# Patient Record
Sex: Female | Born: 1935 | Race: White | Hispanic: No | Marital: Married | Smoking: Never smoker
Health system: Southern US, Community
[De-identification: ages and names within clinical notes are randomized; demographics above are authoritative.]

## PROBLEM LIST (undated history)

## (undated) DIAGNOSIS — I639 Cerebral infarction, unspecified: Secondary | ICD-10-CM

## (undated) DIAGNOSIS — E079 Disorder of thyroid, unspecified: Secondary | ICD-10-CM

## (undated) DIAGNOSIS — I10 Essential (primary) hypertension: Secondary | ICD-10-CM

## (undated) DIAGNOSIS — R569 Unspecified convulsions: Secondary | ICD-10-CM

## (undated) DIAGNOSIS — I2510 Atherosclerotic heart disease of native coronary artery without angina pectoris: Secondary | ICD-10-CM

## (undated) HISTORY — PX: ABDOMINAL HYSTERECTOMY: SHX81

## (undated) HISTORY — PX: FRACTURE SURGERY: SHX138

---

## 2007-10-07 ENCOUNTER — Emergency Department: Payer: Self-pay | Admitting: Emergency Medicine

## 2008-03-25 ENCOUNTER — Ambulatory Visit: Payer: Self-pay | Admitting: Neurology

## 2009-02-10 ENCOUNTER — Emergency Department: Payer: Self-pay | Admitting: Emergency Medicine

## 2012-08-31 LAB — BASIC METABOLIC PANEL
Anion Gap: 8 (ref 7–16)
BUN: 18 mg/dL (ref 7–18)
Calcium, Total: 8.5 mg/dL (ref 8.5–10.1)
Chloride: 99 mmol/L (ref 98–107)
Co2: 24 mmol/L (ref 21–32)
Creatinine: 1.14 mg/dL (ref 0.60–1.30)
EGFR (African American): 49 — ABNORMAL LOW
EGFR (Non-African Amer.): 42 — ABNORMAL LOW
Potassium: 4 mmol/L (ref 3.5–5.1)

## 2012-08-31 LAB — CBC
HCT: 35.4 % (ref 35.0–47.0)
HGB: 12.2 g/dL (ref 12.0–16.0)
MCHC: 34.4 g/dL (ref 32.0–36.0)
Platelet: 209 10*3/uL (ref 150–440)
RBC: 3.58 10*6/uL — ABNORMAL LOW (ref 3.80–5.20)
WBC: 11.1 10*3/uL — ABNORMAL HIGH (ref 3.6–11.0)

## 2012-08-31 LAB — TROPONIN I: Troponin-I: <0.02 ng/mL

## 2012-09-01 ENCOUNTER — Observation Stay: Payer: Self-pay | Admitting: Internal Medicine

## 2012-09-01 DIAGNOSIS — I359 Nonrheumatic aortic valve disorder, unspecified: Secondary | ICD-10-CM

## 2012-09-01 LAB — URINALYSIS, COMPLETE
Bilirubin,UR: NEGATIVE
Nitrite: NEGATIVE
Ph: 7 (ref 4.5–8.0)
Protein: 30
RBC,UR: <1 /HPF (ref 0–5)
Specific Gravity: 1.02 (ref 1.003–1.030)
Squamous Epithelial: NONE SEEN

## 2012-09-01 LAB — BASIC METABOLIC PANEL
BUN: 16 mg/dL (ref 7–18)
Calcium, Total: 8.1 mg/dL — ABNORMAL LOW (ref 8.5–10.1)
Co2: 25 mmol/L (ref 21–32)
EGFR (Non-African Amer.): >60
Glucose: 105 mg/dL — ABNORMAL HIGH (ref 65–99)
Osmolality: 272 (ref 275–301)
Sodium: 135 mmol/L — ABNORMAL LOW (ref 136–145)

## 2012-09-19 ENCOUNTER — Emergency Department: Payer: Self-pay | Admitting: Emergency Medicine

## 2012-09-19 LAB — LIPASE, BLOOD: Lipase: 126 U/L (ref 73–393)

## 2012-09-19 LAB — CBC
HCT: 32.3 % — ABNORMAL LOW (ref 35.0–47.0)
HGB: 11.2 g/dL — ABNORMAL LOW (ref 12.0–16.0)
MCV: 97 fL (ref 80–100)
Platelet: 197 10*3/uL (ref 150–440)
RBC: 3.32 10*6/uL — ABNORMAL LOW (ref 3.80–5.20)
RDW: 12.8 % (ref 11.5–14.5)
WBC: 12.2 10*3/uL — ABNORMAL HIGH (ref 3.6–11.0)

## 2012-09-19 LAB — COMPREHENSIVE METABOLIC PANEL
BUN: 13 mg/dL (ref 7–18)
Bilirubin,Total: 0.6 mg/dL (ref 0.2–1.0)
Chloride: 99 mmol/L (ref 98–107)
Co2: 26 mmol/L (ref 21–32)
EGFR (African American): >60
Glucose: 86 mg/dL (ref 65–99)
Osmolality: 264 (ref 275–301)
SGOT(AST): 26 U/L (ref 15–37)
Sodium: 132 mmol/L — ABNORMAL LOW (ref 136–145)

## 2012-09-19 LAB — URINALYSIS, COMPLETE
Bacteria: NONE SEEN
Blood: NEGATIVE
Glucose,UR: NEGATIVE mg/dL (ref 0–75)
Nitrite: NEGATIVE
Ph: 7 (ref 4.5–8.0)
Protein: 25
RBC,UR: 7 /HPF (ref 0–5)
Specific Gravity: 1.015 (ref 1.003–1.030)
WBC UR: 223 /HPF (ref 0–5)

## 2012-09-19 LAB — TROPONIN I: Troponin-I: <0.02 ng/mL

## 2012-09-19 LAB — TSH: Thyroid Stimulating Horm: 3.05 u[IU]/mL

## 2012-09-21 ENCOUNTER — Emergency Department: Payer: Self-pay | Admitting: Emergency Medicine

## 2012-10-10 ENCOUNTER — Emergency Department: Payer: Self-pay | Admitting: Emergency Medicine

## 2012-10-10 LAB — COMPREHENSIVE METABOLIC PANEL
Albumin: 3.2 g/dL — ABNORMAL LOW (ref 3.4–5.0)
Alkaline Phosphatase: 107 U/L (ref 50–136)
Anion Gap: 8 (ref 7–16)
BUN: 12 mg/dL (ref 7–18)
Calcium, Total: 9 mg/dL (ref 8.5–10.1)
Co2: 25 mmol/L (ref 21–32)
Creatinine: 1.02 mg/dL (ref 0.60–1.30)
EGFR (African American): 56 — ABNORMAL LOW
EGFR (Non-African Amer.): 48 — ABNORMAL LOW
Glucose: 114 mg/dL — ABNORMAL HIGH (ref 65–99)
Osmolality: 261 (ref 275–301)
Potassium: 3.9 mmol/L (ref 3.5–5.1)
SGOT(AST): 25 U/L (ref 15–37)
SGPT (ALT): 23 U/L (ref 12–78)

## 2012-10-10 LAB — URINALYSIS, COMPLETE
Bilirubin,UR: NEGATIVE
Blood: NEGATIVE
Glucose,UR: NEGATIVE mg/dL (ref 0–75)
Hyaline Cast: 3
RBC,UR: NONE SEEN /HPF (ref 0–5)
WBC UR: 1 /HPF (ref 0–5)

## 2012-10-10 LAB — CBC
HCT: 38 % (ref 35.0–47.0)
HGB: 13.1 g/dL (ref 12.0–16.0)
MCH: 33.8 pg (ref 26.0–34.0)
MCHC: 34.4 g/dL (ref 32.0–36.0)
MCV: 98 fL (ref 80–100)
Platelet: 214 10*3/uL (ref 150–440)
RDW: 12.3 % (ref 11.5–14.5)

## 2012-10-10 LAB — TROPONIN I: Troponin-I: <0.02 ng/mL

## 2012-10-11 ENCOUNTER — Emergency Department: Payer: Self-pay | Admitting: Emergency Medicine

## 2012-10-11 LAB — COMPREHENSIVE METABOLIC PANEL
Anion Gap: 9 (ref 7–16)
Bilirubin,Total: 0.5 mg/dL (ref 0.2–1.0)
Chloride: 101 mmol/L (ref 98–107)
Co2: 23 mmol/L (ref 21–32)
Creatinine: 1.42 mg/dL — ABNORMAL HIGH (ref 0.60–1.30)
Glucose: 98 mg/dL (ref 65–99)
Osmolality: 269 (ref 275–301)
Potassium: 4.2 mmol/L (ref 3.5–5.1)
SGOT(AST): 25 U/L (ref 15–37)
SGPT (ALT): 19 U/L (ref 12–78)
Sodium: 133 mmol/L — ABNORMAL LOW (ref 136–145)

## 2012-10-11 LAB — CBC WITH DIFFERENTIAL/PLATELET
Basophil %: 0.9 %
HCT: 34.5 % — ABNORMAL LOW (ref 35.0–47.0)
HGB: 11.9 g/dL — ABNORMAL LOW (ref 12.0–16.0)
Lymphocyte %: 25.4 %
MCH: 33.7 pg (ref 26.0–34.0)
MCHC: 34.5 g/dL (ref 32.0–36.0)
MCV: 98 fL (ref 80–100)
Monocyte %: 10.9 %
Neutrophil #: 6.2 10*3/uL (ref 1.4–6.5)
Neutrophil %: 62.5 %
RBC: 3.52 10*6/uL — ABNORMAL LOW (ref 3.80–5.20)
RDW: 12.4 % (ref 11.5–14.5)

## 2012-10-11 LAB — CBC
MCH: 33.9 pg (ref 26.0–34.0)
RDW: 12.4 % (ref 11.5–14.5)

## 2013-01-04 ENCOUNTER — Ambulatory Visit: Payer: Self-pay | Admitting: Internal Medicine

## 2013-01-28 ENCOUNTER — Ambulatory Visit: Payer: Self-pay | Admitting: Orthopedic Surgery

## 2013-01-28 ENCOUNTER — Inpatient Hospital Stay: Payer: Self-pay | Admitting: Internal Medicine

## 2013-01-28 LAB — COMPREHENSIVE METABOLIC PANEL
ALBUMIN: 3 g/dL — AB (ref 3.4–5.0)
ALK PHOS: 101 U/L
ALT: 15 U/L (ref 12–78)
ANION GAP: 6 — AB (ref 7–16)
AST: 28 U/L (ref 15–37)
BUN: 13 mg/dL (ref 7–18)
Bilirubin,Total: 0.5 mg/dL (ref 0.2–1.0)
CO2: 26 mmol/L (ref 21–32)
Calcium, Total: 9 mg/dL (ref 8.5–10.1)
Chloride: 102 mmol/L (ref 98–107)
Creatinine: 0.87 mg/dL (ref 0.60–1.30)
EGFR (African American): >60
EGFR (Non-African Amer.): 59 — ABNORMAL LOW
Glucose: 87 mg/dL (ref 65–99)
Osmolality: 268 (ref 275–301)
POTASSIUM: 3.5 mmol/L (ref 3.5–5.1)
Sodium: 134 mmol/L — ABNORMAL LOW (ref 136–145)
Total Protein: 6.5 g/dL (ref 6.4–8.2)

## 2013-01-28 LAB — CBC
HCT: 36.3 % (ref 35.0–47.0)
HGB: 12 g/dL (ref 12.0–16.0)
MCH: 32.5 pg (ref 26.0–34.0)
MCHC: 33 g/dL (ref 32.0–36.0)
MCV: 99 fL (ref 80–100)
PLATELETS: 170 10*3/uL (ref 150–440)
RBC: 3.68 10*6/uL — ABNORMAL LOW (ref 3.80–5.20)
RDW: 13.5 % (ref 11.5–14.5)
WBC: 15.6 10*3/uL — ABNORMAL HIGH (ref 3.6–11.0)

## 2013-01-28 LAB — URINALYSIS, COMPLETE
BACTERIA: NONE SEEN
BLOOD: NEGATIVE
Bilirubin,UR: NEGATIVE
Glucose,UR: NEGATIVE mg/dL (ref 0–75)
Nitrite: NEGATIVE
PROTEIN: NEGATIVE
Ph: 7 (ref 4.5–8.0)
RBC,UR: 2 /HPF (ref 0–5)
SPECIFIC GRAVITY: 1.015 (ref 1.003–1.030)
Squamous Epithelial: 3
WBC UR: 3 /HPF (ref 0–5)

## 2013-01-28 LAB — PROTIME-INR
INR: 1
Prothrombin Time: 13.4 secs (ref 11.5–14.7)

## 2013-01-28 LAB — APTT: ACTIVATED PTT: 23.6 s (ref 23.6–35.9)

## 2013-01-29 LAB — CBC WITH DIFFERENTIAL/PLATELET
BASOS ABS: 0 10*3/uL (ref 0.0–0.1)
Basophil %: 0.5 %
EOS ABS: 0 10*3/uL (ref 0.0–0.7)
Eosinophil %: 0.5 %
HCT: 33.7 % — ABNORMAL LOW (ref 35.0–47.0)
HGB: 11.1 g/dL — ABNORMAL LOW (ref 12.0–16.0)
LYMPHS ABS: 1.1 10*3/uL (ref 1.0–3.6)
LYMPHS PCT: 14 %
MCH: 32.3 pg (ref 26.0–34.0)
MCHC: 32.9 g/dL (ref 32.0–36.0)
MCV: 98 fL (ref 80–100)
MONOS PCT: 9.2 %
Monocyte #: 0.7 x10 3/mm (ref 0.2–0.9)
NEUTROS ABS: 6.1 10*3/uL (ref 1.4–6.5)
Neutrophil %: 75.8 %
Platelet: 155 10*3/uL (ref 150–440)
RBC: 3.43 10*6/uL — AB (ref 3.80–5.20)
RDW: 13.3 % (ref 11.5–14.5)
WBC: 8 10*3/uL (ref 3.6–11.0)

## 2013-01-29 LAB — BASIC METABOLIC PANEL
ANION GAP: 7 (ref 7–16)
BUN: 15 mg/dL (ref 7–18)
CALCIUM: 8.4 mg/dL — AB (ref 8.5–10.1)
CO2: 27 mmol/L (ref 21–32)
Chloride: 99 mmol/L (ref 98–107)
Creatinine: 0.75 mg/dL (ref 0.60–1.30)
EGFR (African American): >60
EGFR (Non-African Amer.): >60
Glucose: 92 mg/dL (ref 65–99)
OSMOLALITY: 267 (ref 275–301)
Potassium: 3.7 mmol/L (ref 3.5–5.1)
Sodium: 133 mmol/L — ABNORMAL LOW (ref 136–145)

## 2013-01-30 DIAGNOSIS — I359 Nonrheumatic aortic valve disorder, unspecified: Secondary | ICD-10-CM

## 2013-01-30 LAB — BASIC METABOLIC PANEL
Anion Gap: 9 (ref 7–16)
BUN: 20 mg/dL — AB (ref 7–18)
CALCIUM: 7.6 mg/dL — AB (ref 8.5–10.1)
CO2: 24 mmol/L (ref 21–32)
Chloride: 98 mmol/L (ref 98–107)
Creatinine: 0.95 mg/dL (ref 0.60–1.30)
EGFR (Non-African Amer.): 53 — ABNORMAL LOW
GLUCOSE: 145 mg/dL — AB (ref 65–99)
Osmolality: 268 (ref 275–301)
Potassium: 3.5 mmol/L (ref 3.5–5.1)
Sodium: 131 mmol/L — ABNORMAL LOW (ref 136–145)

## 2013-01-30 LAB — CBC WITH DIFFERENTIAL/PLATELET
BASOS ABS: 0 10*3/uL (ref 0.0–0.1)
Basophil %: 0.3 %
Eosinophil #: 0 10*3/uL (ref 0.0–0.7)
Eosinophil %: 0.1 %
HCT: 27.4 % — AB (ref 35.0–47.0)
HGB: 9.2 g/dL — AB (ref 12.0–16.0)
Lymphocyte #: 1.6 10*3/uL (ref 1.0–3.6)
Lymphocyte %: 17 %
MCH: 32.5 pg (ref 26.0–34.0)
MCHC: 33.7 g/dL (ref 32.0–36.0)
MCV: 97 fL (ref 80–100)
MONO ABS: 1.2 x10 3/mm — AB (ref 0.2–0.9)
MONOS PCT: 13.2 %
NEUTROS ABS: 6.3 10*3/uL (ref 1.4–6.5)
NEUTROS PCT: 69.4 %
PLATELETS: 142 10*3/uL — AB (ref 150–440)
RBC: 2.83 10*6/uL — ABNORMAL LOW (ref 3.80–5.20)
RDW: 13.2 % (ref 11.5–14.5)
WBC: 9.1 10*3/uL (ref 3.6–11.0)

## 2013-01-30 LAB — MAGNESIUM: MAGNESIUM: 1.1 mg/dL — AB

## 2013-01-30 LAB — CK-MB
CK-MB: 6.1 ng/mL — ABNORMAL HIGH (ref 0.5–3.6)
CK-MB: 5.1 ng/mL — AB (ref 0.5–3.6)
CK-MB: 5.3 ng/mL — AB (ref 0.5–3.6)

## 2013-01-30 LAB — TROPONIN I
TROPONIN-I: 0.31 ng/mL — AB
TROPONIN-I: 0.25 ng/mL — AB
Troponin-I: 0.2 ng/mL — ABNORMAL HIGH

## 2013-01-30 LAB — URINE CULTURE

## 2013-01-30 LAB — PHOSPHORUS: Phosphorus: 3.5 mg/dL (ref 2.5–4.9)

## 2013-01-31 LAB — BASIC METABOLIC PANEL
Anion Gap: 8 (ref 7–16)
BUN: 20 mg/dL — ABNORMAL HIGH (ref 7–18)
CHLORIDE: 97 mmol/L — AB (ref 98–107)
CO2: 22 mmol/L (ref 21–32)
Calcium, Total: 7.3 mg/dL — ABNORMAL LOW (ref 8.5–10.1)
Creatinine: 0.79 mg/dL (ref 0.60–1.30)
EGFR (African American): >60
EGFR (Non-African Amer.): >60
Glucose: 110 mg/dL — ABNORMAL HIGH (ref 65–99)
OSMOLALITY: 258 (ref 275–301)
Potassium: 3.8 mmol/L (ref 3.5–5.1)
Sodium: 127 mmol/L — ABNORMAL LOW (ref 136–145)

## 2013-01-31 LAB — CBC WITH DIFFERENTIAL/PLATELET
BASOS ABS: 0 10*3/uL (ref 0.0–0.1)
BASOS PCT: 0.2 %
Eosinophil #: 0.1 10*3/uL (ref 0.0–0.7)
Eosinophil %: 0.9 %
HCT: 24.1 % — ABNORMAL LOW (ref 35.0–47.0)
LYMPHS PCT: 15.6 %
Lymphocyte #: 1.5 10*3/uL (ref 1.0–3.6)
MCH: 33.4 pg (ref 26.0–34.0)
MCHC: 35.1 g/dL (ref 32.0–36.0)
MCV: 95 fL (ref 80–100)
Monocyte #: 1.1 x10 3/mm — ABNORMAL HIGH (ref 0.2–0.9)
Monocyte %: 11.6 %
NEUTROS ABS: 7 10*3/uL — AB (ref 1.4–6.5)
Neutrophil %: 71.7 %
Platelet: 120 10*3/uL — ABNORMAL LOW (ref 150–440)
RBC: 2.53 10*6/uL — ABNORMAL LOW (ref 3.80–5.20)
RDW: 13.6 % (ref 11.5–14.5)
WBC: 9.8 10*3/uL (ref 3.6–11.0)

## 2013-01-31 LAB — PHOSPHORUS: Phosphorus: 3 mg/dL (ref 2.5–4.9)

## 2013-01-31 LAB — HEMOGLOBIN: HGB: 8.4 g/dL — AB (ref 12.0–16.0)

## 2013-01-31 LAB — MAGNESIUM: MAGNESIUM: 2 mg/dL

## 2013-02-01 LAB — BASIC METABOLIC PANEL
Anion Gap: 5 — ABNORMAL LOW (ref 7–16)
BUN: 17 mg/dL (ref 7–18)
Calcium, Total: 7.2 mg/dL — ABNORMAL LOW (ref 8.5–10.1)
Chloride: 101 mmol/L (ref 98–107)
Co2: 25 mmol/L (ref 21–32)
Creatinine: 0.69 mg/dL (ref 0.60–1.30)
EGFR (African American): >60
EGFR (Non-African Amer.): >60
Glucose: 79 mg/dL (ref 65–99)
OSMOLALITY: 263 (ref 275–301)
Potassium: 3.7 mmol/L (ref 3.5–5.1)
SODIUM: 131 mmol/L — AB (ref 136–145)

## 2013-02-01 LAB — CBC WITH DIFFERENTIAL/PLATELET
Basophil #: 0 10*3/uL (ref 0.0–0.1)
Basophil %: 0.3 %
EOS PCT: 1 %
Eosinophil #: 0.1 10*3/uL (ref 0.0–0.7)
HCT: 21.7 % — AB (ref 35.0–47.0)
HGB: 7.3 g/dL — AB (ref 12.0–16.0)
LYMPHS PCT: 15.9 %
Lymphocyte #: 1.3 10*3/uL (ref 1.0–3.6)
MCH: 32.4 pg (ref 26.0–34.0)
MCHC: 33.4 g/dL (ref 32.0–36.0)
MCV: 97 fL (ref 80–100)
MONO ABS: 0.8 x10 3/mm (ref 0.2–0.9)
MONOS PCT: 9.8 %
Neutrophil #: 6.1 10*3/uL (ref 1.4–6.5)
Neutrophil %: 73 %
PLATELETS: 114 10*3/uL — AB (ref 150–440)
RBC: 2.24 10*6/uL — AB (ref 3.80–5.20)
RDW: 13.2 % (ref 11.5–14.5)
WBC: 8.3 10*3/uL (ref 3.6–11.0)

## 2013-02-01 LAB — MAGNESIUM: MAGNESIUM: 1.7 mg/dL — AB

## 2013-02-01 LAB — PHOSPHORUS: PHOSPHORUS: 2.5 mg/dL (ref 2.5–4.9)

## 2013-02-02 LAB — CBC WITH DIFFERENTIAL/PLATELET
Basophil #: 0 10*3/uL (ref 0.0–0.1)
Basophil %: 0.5 %
EOS ABS: 0.2 10*3/uL (ref 0.0–0.7)
EOS PCT: 1.9 %
HCT: 23.1 % — ABNORMAL LOW (ref 35.0–47.0)
HGB: 8 g/dL — ABNORMAL LOW (ref 12.0–16.0)
Lymphocyte #: 1.4 10*3/uL (ref 1.0–3.6)
Lymphocyte %: 16.5 %
MCH: 33.1 pg (ref 26.0–34.0)
MCHC: 34.5 g/dL (ref 32.0–36.0)
MCV: 96 fL (ref 80–100)
Monocyte #: 1 x10 3/mm — ABNORMAL HIGH (ref 0.2–0.9)
Monocyte %: 11.9 %
NEUTROS ABS: 5.8 10*3/uL (ref 1.4–6.5)
Neutrophil %: 69.2 %
Platelet: 126 10*3/uL — ABNORMAL LOW (ref 150–440)
RBC: 2.41 10*6/uL — ABNORMAL LOW (ref 3.80–5.20)
RDW: 13.6 % (ref 11.5–14.5)
WBC: 8.4 10*3/uL (ref 3.6–11.0)

## 2013-02-03 LAB — CBC WITH DIFFERENTIAL/PLATELET
BASOS ABS: 0 10*3/uL (ref 0.0–0.1)
Basophil %: 0.5 %
EOS PCT: 2 %
Eosinophil #: 0.2 10*3/uL (ref 0.0–0.7)
HCT: 21.8 % — ABNORMAL LOW (ref 35.0–47.0)
HGB: 7.5 g/dL — AB (ref 12.0–16.0)
LYMPHS ABS: 1.5 10*3/uL (ref 1.0–3.6)
LYMPHS PCT: 17.4 %
MCH: 33.3 pg (ref 26.0–34.0)
MCHC: 34.3 g/dL (ref 32.0–36.0)
MCV: 97 fL (ref 80–100)
Monocyte #: 1.4 x10 3/mm — ABNORMAL HIGH (ref 0.2–0.9)
Monocyte %: 15.7 %
NEUTROS PCT: 64.4 %
Neutrophil #: 5.7 10*3/uL (ref 1.4–6.5)
PLATELETS: 149 10*3/uL — AB (ref 150–440)
RBC: 2.24 10*6/uL — ABNORMAL LOW (ref 3.80–5.20)
RDW: 13.6 % (ref 11.5–14.5)
WBC: 8.9 10*3/uL (ref 3.6–11.0)

## 2013-02-04 ENCOUNTER — Ambulatory Visit: Payer: Self-pay | Admitting: Internal Medicine

## 2013-02-04 LAB — CBC WITH DIFFERENTIAL/PLATELET
BASOS PCT: 0.9 %
Basophil #: 0.1 10*3/uL (ref 0.0–0.1)
EOS ABS: 0.2 10*3/uL (ref 0.0–0.7)
Eosinophil %: 2.5 %
HCT: 25.4 % — ABNORMAL LOW (ref 35.0–47.0)
HGB: 8.7 g/dL — AB (ref 12.0–16.0)
LYMPHS ABS: 1.7 10*3/uL (ref 1.0–3.6)
LYMPHS PCT: 18 %
MCH: 32.5 pg (ref 26.0–34.0)
MCHC: 34.3 g/dL (ref 32.0–36.0)
MCV: 95 fL (ref 80–100)
MONOS PCT: 18.8 %
Monocyte #: 1.8 x10 3/mm — ABNORMAL HIGH (ref 0.2–0.9)
Neutrophil #: 5.6 10*3/uL (ref 1.4–6.5)
Neutrophil %: 59.8 %
Platelet: 166 10*3/uL (ref 150–440)
RBC: 2.68 10*6/uL — AB (ref 3.80–5.20)
RDW: 14.9 % — ABNORMAL HIGH (ref 11.5–14.5)
WBC: 9.4 10*3/uL (ref 3.6–11.0)

## 2013-02-05 LAB — CBC WITH DIFFERENTIAL/PLATELET
Basophil #: 0 10*3/uL (ref 0.0–0.1)
Basophil %: 0.5 %
EOS PCT: 1.8 %
Eosinophil #: 0.2 10*3/uL (ref 0.0–0.7)
HCT: 24.9 % — ABNORMAL LOW (ref 35.0–47.0)
HGB: 8.3 g/dL — ABNORMAL LOW (ref 12.0–16.0)
Lymphocyte #: 1.4 10*3/uL (ref 1.0–3.6)
Lymphocyte %: 16.2 %
MCH: 31.9 pg (ref 26.0–34.0)
MCHC: 33.2 g/dL (ref 32.0–36.0)
MCV: 96 fL (ref 80–100)
Monocyte #: 1.3 x10 3/mm — ABNORMAL HIGH (ref 0.2–0.9)
Monocyte %: 15.8 %
Neutrophil #: 5.6 10*3/uL (ref 1.4–6.5)
Neutrophil %: 65.7 %
Platelet: 208 10*3/uL (ref 150–440)
RBC: 2.58 10*6/uL — ABNORMAL LOW (ref 3.80–5.20)
RDW: 14.5 % (ref 11.5–14.5)
WBC: 8.5 10*3/uL (ref 3.6–11.0)

## 2013-02-05 LAB — CREATININE, SERUM
Creatinine: 0.62 mg/dL (ref 0.60–1.30)
EGFR (Non-African Amer.): >60

## 2013-02-08 LAB — WOUND CULTURE

## 2013-02-13 LAB — PATHOLOGY REPORT

## 2013-12-26 IMAGING — CT CT HEAD WITHOUT CONTRAST
3 series · 18 of 30 positions shown, 20 images · IV contrast (agent unspecified)
Comparison: none

REASON FOR EXAM: SYNCOPE, ALTERED MENTAL STATUS.
COMMENTS:

PROCEDURE:     CT  - CT HEAD WITHOUT CONTRAST  - October 10, 2012  [DATE]
RESULT:     History: Syncope.
TECHNIQUE: Standard multidetector CT obtained.
Contrast: None.
Comparison Study: CT 08/31/2012.

[Series 2: without · axial · non-contrast · 0.46mm/px · z∈[-221,-101]mm · 8 of 32 slices shown]
[im 4/32  brain]
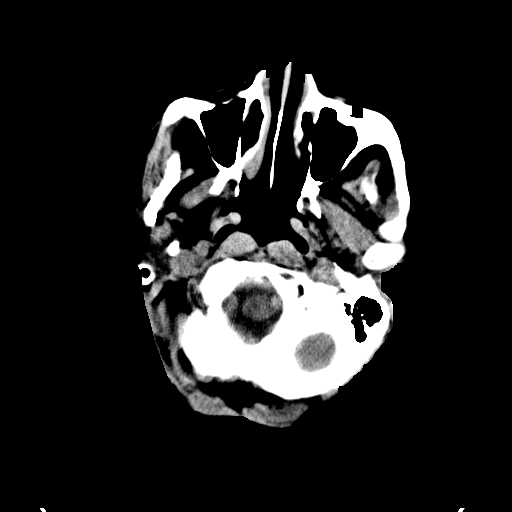
[im 7/32  brain]
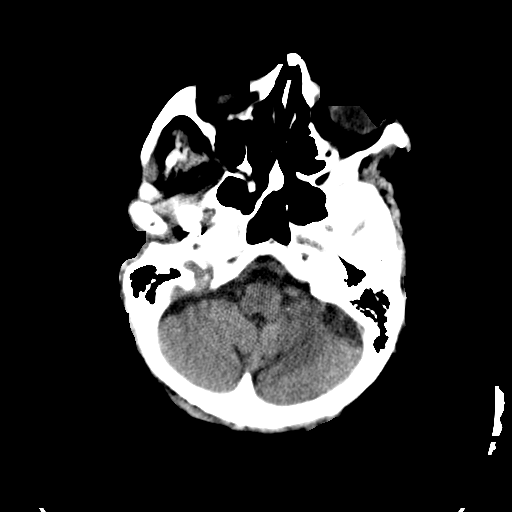
[im 11/32  brain]
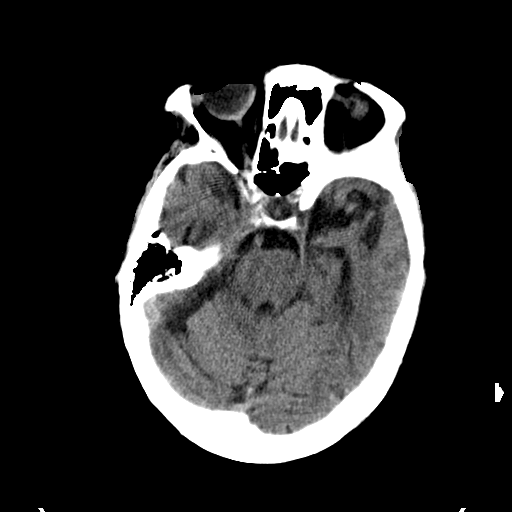
[im 14/32  brain]
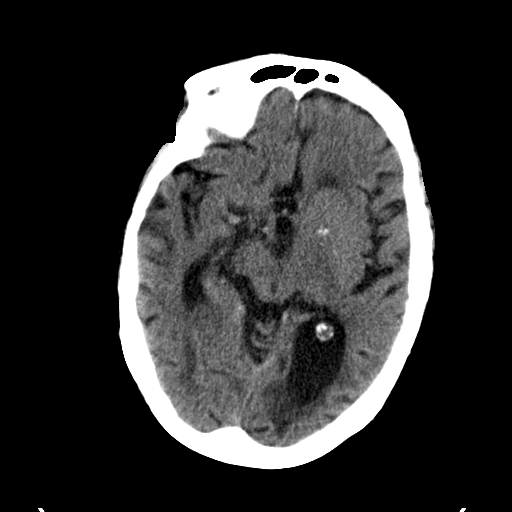
[im 18/32  brain]
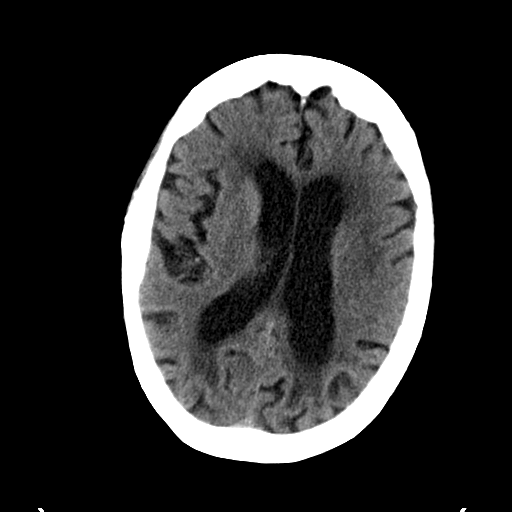
[im 21/32  brain]
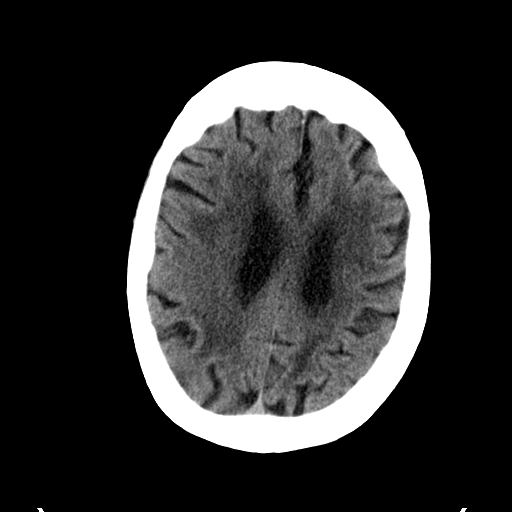
[im 25/32  brain]
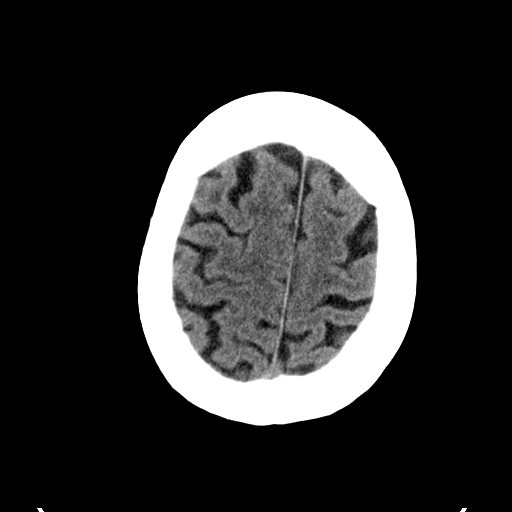
[im 28/32  brain]
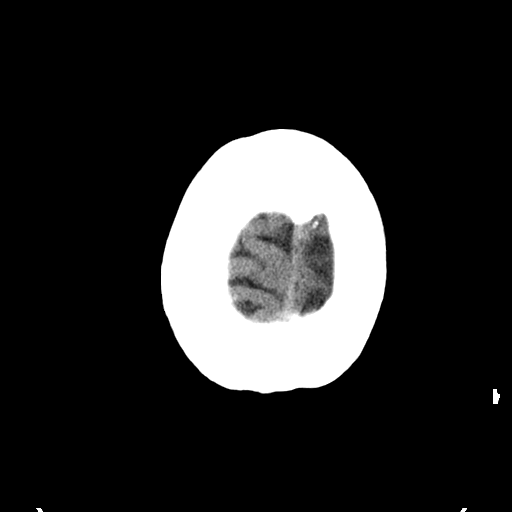

[Series 3: bone · axial · 0.46mm/px · z∈[-221,-206]mm · 2 of 32 slices shown]
[im 4/32  bone]
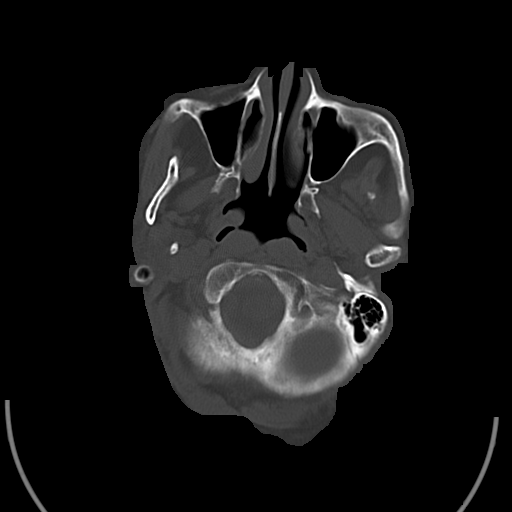
[im 7/32  bone]
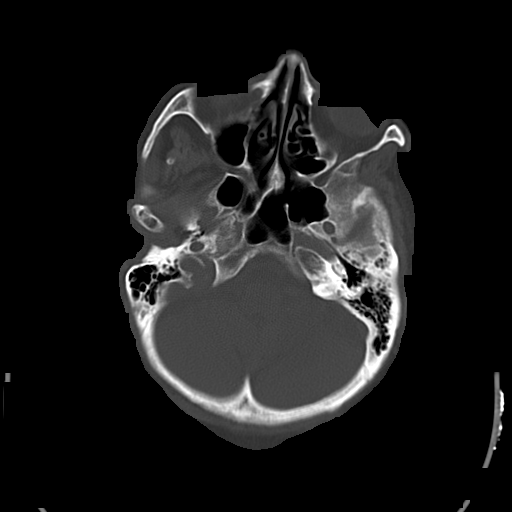

[Series 4: without recon · axial · non-contrast · 0.46mm/px · z∈[-199,-80]mm · 8 of 32 slices shown, 10 images]
[im 4/32  brain]
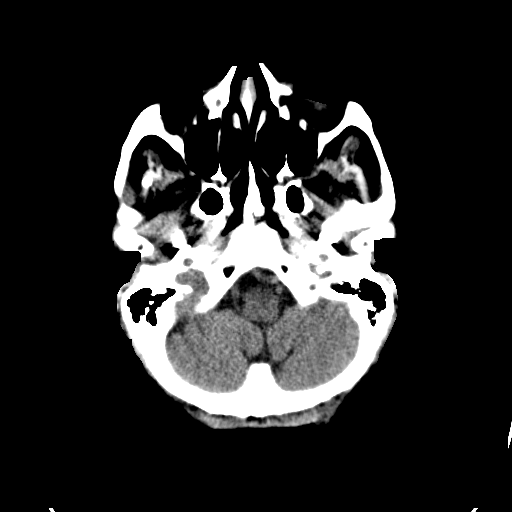
[im 4/32  bone]
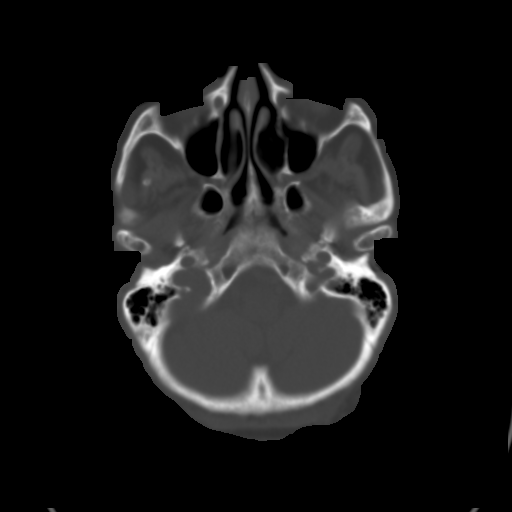
[im 7/32  brain]
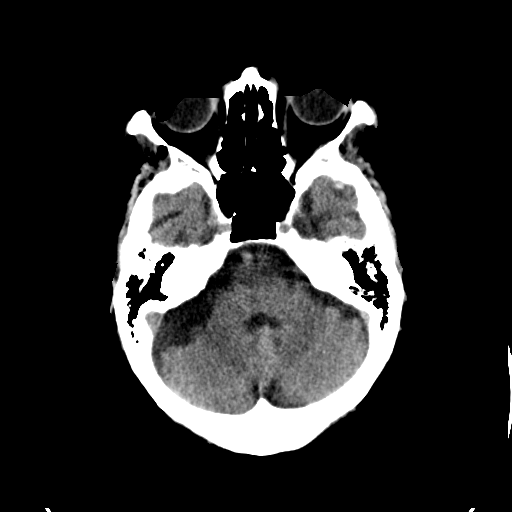
[im 11/32  brain]
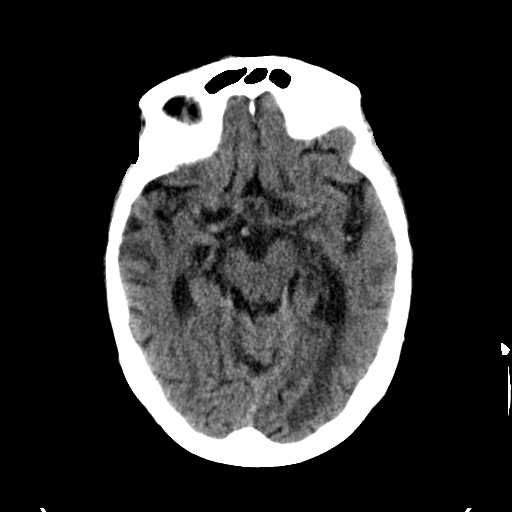
[im 14/32  brain]
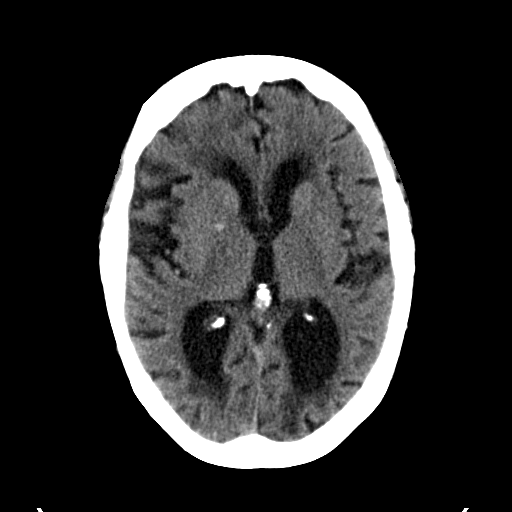
[im 18/32  brain]
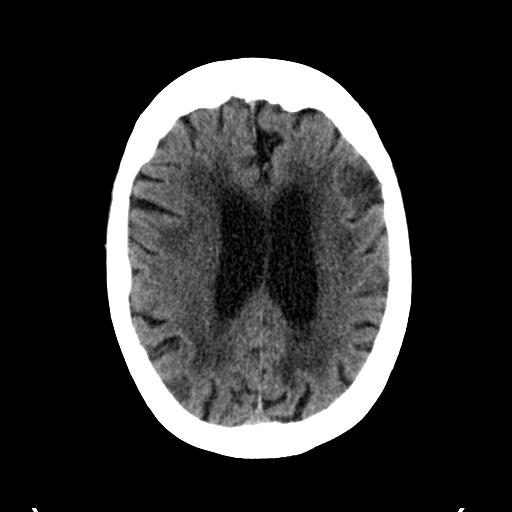
[im 18/32  bone]
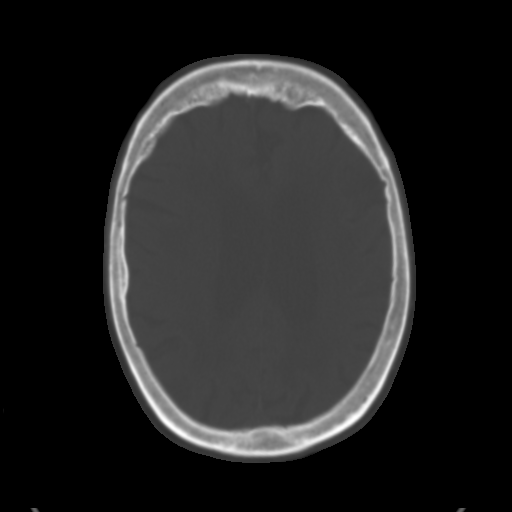
[im 21/32  brain]
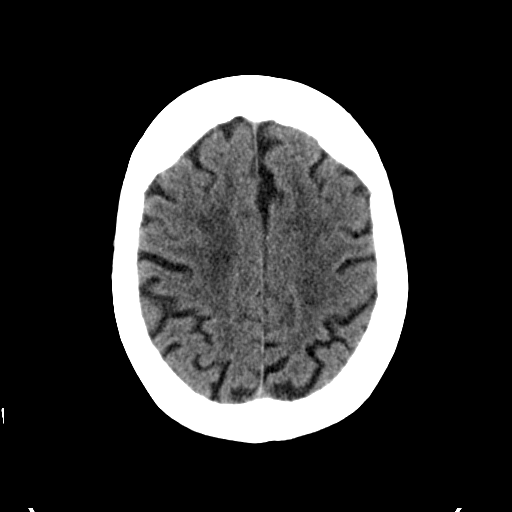
[im 25/32  brain]
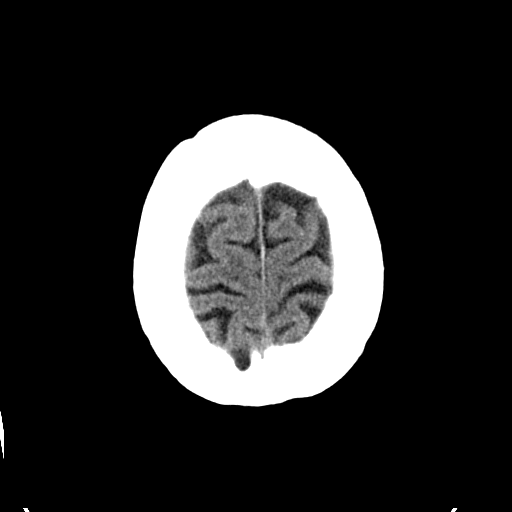
[im 28/32  brain]
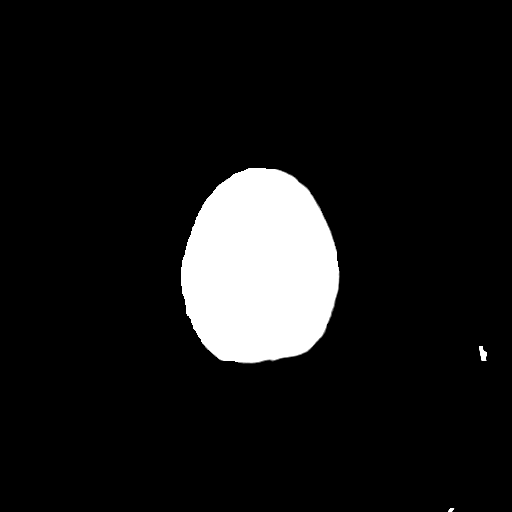

[18 of 30 positions shown; findings below may reference images not displayed]

FINDINGS: Standard nonenhanced CT obtained. Diffuse atrophy present. Stable
chronic white matter changes noted consistent with chronic ischemia. Basal
ganglia calcification. No hemorrhage or mass. Mild ventricular dilatation
consistent with degree of atrophy present. Cerebellar atrophy. Orbits
unremarkable. Paranasal sinuses unremarkable. Mastoids clear. No bony
abnormality.
IMPRESSION: Chronic White matter ischemic change. Atrophy. Exam stable
from 08/31/2012. No acute abnormality.

## 2014-04-26 NOTE — Discharge Summary (Signed)
PATIENT NAME:  Katrina Gonzalez, [name] MR#:  [id number] DATE OF BIRTH:  [birth year]  ADMITTING DIAGNOSES: Syncope.  DISCHARGE DIAGNOSES: 1.  Syncope. 2.  Hyponatremia. 3.  Malignant hypertension. 4.  Hypothyroidism. 5.  Dementia. 6.  Constipation. 7.  Hyperlipidemia.  DISCHARGE CONDITION: Stable.  DISCHARGE MEDICATIONS: The patient is to resume her outpatient medications which are Synthroid 100 mcg p.o. daily, lovastatin 10 mg p.o. at bedtime, donepezil unknown dose daily, Namenda unknown dose and frequency daily, vitamin B12 unknown dose and frequency, quinapril 12 mg p.o. daily. This is a new dose. Docusate sodium 100 mg p.o. twice daily as needed, and senna 1 tablet once daily at bedtime as needed.  Advance dose of quinapril as well as new medications, docusate as well as senna.  DIET: Two grams salt, low fat, low cholesterol.  ACTIVITY LIMITATIONS: As tolerated.  FOLLOW-UP: Appointment with Dr. Pilar Grammeshomas Wroth in two days after discharge.   CONSULTANTS: Care management.  RADIOLOGIC STUDIES: CT scan of the head without contrast done on August 31, 2012 revealed chronic and involutional changes without evidence of acute abnormalities.  Chest x-ray, PA and lateral, 01 September 2012 revealed no evidence of pneumonia. Mild enlargement of cardiac silhouette without evidence of CHF was noted.  Abdomen, flat and erect, 01 September 2012, revealed bowel gas pattern which was nonspecific. Cannot exclude an element of constipation in appropriate clinical setting according to radiologist.  Ultrasound of carotid arteries, bilateral, 01 September 2012, showed no evidence of hemodynamically significant carotid stenosis.  Echocardiogram, read by Dr. Kirke CorinArida, read 01 September 2012, revealed left ventricular ejection fraction by visual estimation 55% to 60%, normal global left ventricular systolic function, impaired relaxation pattern of left ventricular diastolic filling, mild posterior ventricular hypertrophy,  mild-to-moderate aortic regurgitation, mild tricuspid regurgitation.  HISTORY OF PRESENT ILLNESS: The patient is a 81 year old female with past medical history significant for history of hyperlipidemia, also dementia and hypertension, hypothyroidism, who presents to the hospital with complaints of syncopal episode. Please refer to Dr. Jarrett SohoHower's admission note on the 29th of August 2014. Apparently the patient was in the bathroom and she syncopized.  The syncope did not seem to be prolonged. It was just for a few seconds; however, the patient was poorly responsive still for the next approximately five minutes according to patient's family. There was no urinary or bowel incontinence, also tongue biting; however, the patient's family noted some foaming at the mouth.  The patient was brought to the hospital for further evaluation. In the Emergency Room, she was afebrile with temperature of 98.3. Pulse was 68. Respiration rate was 18. Blood pressure 136/70. Respiratory rate was 99% on room air.  Physical exam was unremarkable.  The patient's EKG revealed normal sinus rhythm. No acute ST-T changes were noted.  The patient's lab data done in the Emergency Room showed mildly elevated glucose to 109. Sodium 131; otherwise, BMP was unremarkable. The patient's troponin was less than 0.02. White blood cell count was elevated at 11.2. Hemoglobin was 12.2. Platelet count was 209.  Urinalysis showed yellow, hazy urine, negative for glucose and bilirubin. Trace ketones were noted. Specific gravity was 1.020 with pH 7.0, negative for blood, 30 mg/dL protein, negative for nitrites or leukocyte esterase, less than 1 red blood cell, 2 white blood cells, trace bacteria, no epithelial cells but mucus was present.  The patient was admitted to the hospital for further evaluation. Her cause of syncope was investigated. The patient had echocardiogram done as well as carotid ultrasound, which were  unremarkable. She  also had  orthostatic vital signs checked which were also within normal limits.  The patient also had no significant abnormalities on telemetry.  As the patient's studies were unremarkable, I discussed the patient's condition as well as her treatment plan with family, and they were agreeable for patient to return to home. They did not feel that the patient has any needs in regards to home health physical therapy or RN or aide.  In regards to hyponatremia, the patient had IV fluids administered and her sodium level has somewhat improved to 135 by the day of discharge. It was felt that the patient's hyponatremia was very likely due to a mild element of dehydration even though orthostatic vital signs did not show any significant abnormalities.  In regards to hypertension, the patient's blood pressure was markedly elevated initially on arrival to the hospital; however, the patient's blood pressure improved with medications. We felt that the patient would benefit from advancement of her blood pressure medications and advancement of quinapril to 10 mg p.o. daily dose. We felt, however, that the patient would benefit from those medications given at bedtime.  In regards to hypothyroidism, the patient is to continue her outpatient management. The patient's family was complaining of the patient being constipated as well as having problems with burping. Abdominal x-rays done in the hospital did not show any obstruction or ileus; however, it showed an element of constipation. We initiated the patient on Colace as well as Dulcolax with hopes that that would improve her oral intake as well as her fluid intake.  The patient is being discharged in stable condition with the above-mentioned medications and follow-up.  DISCHARGE DAY VITAL SIGNS: Temperature was 97.8. Pulse was 63. Respiratory rate was 18. Blood pressure 156/70. Saturation was 96% on room air at rest.  TIME SPENT: 40 minutes. ____________________________ Katrina Caper, MD rv:np D: 09/01/2012 18:23:00 ET T: 09/01/2012 19:27:28 ET JOB#: [id number] cc: Sydnee Cabal. Butler Denmark, MD Katrina Caper MD ELECTRONICALLY SIGNED 09/23/2012 18:13

## 2014-04-26 NOTE — H&P (Signed)
PATIENT NAME:  Katrina Gonzalez, Katrina Gonzalez MR#:  [id number] DATE OF BIRTH:  July 29, 1920  DATE OF ADMISSION:  09/01/2012  PATIENT'S PRIMARY CARE PHYSICIAN:  Dr. Butler DenmarkWroth  REFERRING PHYSICIAN:  [place] Reita MayEric Komar  HISTORY OF PRESENT ILLNESS:  Ms. Toni ArthursFuller is a 81 year old African-American female with a past medical history of dementia, hypertension, hyperthyroidism, hyperlipidemia, who was brought in after a syncopal episode in the bathroom at her home. She was found by her daughter who lives with her. According to her daughter, the estimated time of loss of consciousness was about 5 minutes. After the episode, her mother returned to normal baseline. There was no urinary or bowel incontinence. No tongue biting was noted. She has had 2 similar episodes in the past; both related to constipation and going down. Those symptoms, however, were mush shorter lived and they did not come to the hospital for further workup or evaluation.  Aside from this syncopal episode prompting her, she has had no other complaints. She is at her usual baseline. She has some mild constipation over the last week in which they had to use suppositories, otherwise, there is no change in p.o. intake, no fever, chills, no further symptoms.  REVIEW OF SYSTEMS: Unable to obtain secondary to patient's mental status.   PAST MEDICAL HISTORY: 1.  Dementia. 2.  Hypertension. 3.  Hyperlipidemia. 4.  Two previous episodes of syncope.   SOCIAL HISTORY:  Denies any alcohol, tobacco or drug use, as she is dependent on her daughter for activities of daily living who she lives with.  ALLERGIES:  No Known Drug Allergies.   FAMILY HISTORY: Significant for diabetes, hypertension.  MEDICATIONS:  Includes;  1.  Donepezil, unknown dosage. 2.  Klor-Con 8 extended release once daily.  3.  Lovastatin 20 mg p.o. at bedtime. 4.  Namenda of unknown dosage. 5.  Quinapril 10 mg p.o. daily. 6.  Synthroid 100 mcg p.o. daily.  7.  Vitamin B12 of unknown dosage.  PHYSICAL  EXAMINATION: VITAL SIGNS:  Temperature 98.3, pulse 68, respiration 18, with blood pressure 136/70. Saturating 99% on room air. GENERAL:  In no acute distress, awake, alert and oriented x 1.   HEENT:  Normocephalic, atraumatic. Extraocular muscles intact. Pupils equal, round and reactive slow to light and accommodation. Moist mucous membranes. CARDIOVASCULAR:  S1, S2, regular rate and rhythm. No murmurs, rubs or gallops.  PULMONARY:  Clear to auscultation bilaterally, no wheezes, rubs or rhonchi. ABDOMEN:  Soft, nontender, nondistended, positive bowel sounds. EXTREMITIES:  Reveal no cyanosis, edema or clubbing.  NEUROLOGICALLY: The patient is uncooperative and unable to do the remainder of the exam.  LABORATORY DATA:  EKG reveals normal sinus rhythm. Heart rate is 71. Sodium 131, potassium 4, chloride 99, bicarb 24, BUN 18, creatinine 1.14, glucose 109. WBC 11.1. Hemoglobin 12.2, hematocrit 35, platelets of 209.  Urinalysis negative for signs of infection.   ASSESSMENT AND PLAN:  A 81 year old African-American female with past medical history of dementia, hypertension, hyperthyroidism, hyperlipidemia, as well as 2 previous syncopal episodes, who is presenting after a syncopal episode while in the bathroom, was told loss of consciousness time estimated to be 5 minutes per daughter; she has returned back to her mental status and functional status.  1.  Syncope, which is situational in nature, most likely related to vagal related to constipation. She will be admitted to observation with cardiac telemetry to rule out any other etiologies.  Will add Colace for constipation and she may require MiraLAX if that does not work. 2.  Hypertension. Continue her home medication of Quinapril. 3.  Hyperthyroidism.  Continue her home medication of Synthroid. 4.  Dementia.  Continue Namenda and Donepezil.   TOTAL TIME SPENT:  25 minutes.     ____________________________ Cletis Athens. Naasir Carreira, MD dkh:nts D: 09/01/2012  02:22:00 ET T: 09/01/2012 02:46:31 ET JOB#: [id number]  cc: Cletis Athens. Carliss Quast, MD, <Dictator> Deonta Bomberger Synetta Shadow MD ELECTRONICALLY SIGNED 09/02/2012 1:56

## 2014-04-27 NOTE — Op Note (Signed)
PATIENT NAME:  Natividad BroodFULLER, Tania MR#:  [id number] DATE OF BIRTH:  1920-07-23  DATE OF PROCEDURE:  01/29/2013  PREOPERATIVE DIAGNOSIS: Right femoral neck hip fracture.   POSTOPERATIVE DIAGNOSIS: Right femoral neck hip fracture.   PROCEDURE: Right hip hemiarthroplasty.   ANESTHESIA: Spinal.   SURGEON: Juanell FairlyKevin Salene Mohamud, MD   SPECIMEN: Femoral head to pathology.   ESTIMATED BLOOD LOSS: 300 mL.   COMPLICATIONS: None.   IMPLANTS: Stryker Accolade HFX femoral stem size 2, Unitrax 48 mm outer head with a +4 neck adjustment sleeve.   INDICATIONS FOR SURGERY: The patient is a 81 year old female diagnosed with a right femoral neck hip fracture upon arrival to the [facility] after a fall at home. I was consulted to see the patient. She was admitted to the hospitalist service and cleared for surgery. I had recommended a right hip hemiarthroplasty given the fracture type and the fact that the patient was ambulatory at baseline.   I reviewed the risks and benefits of surgery with the patient and her daughters. They understand the risks include infection, bleeding requiring blood transfusion, nerve or blood vessel injury, especially injury to the sciatic nerve leading to permanent numbness or footdrop requiring use of an AFO, fracture, dislocation, persistent right hip pain and the need for further surgery. Other surgical risks include leg length discrepancy and change in lower extremity rotation. They also understand medical risks of surgery include DVT and pulmonary embolism, myocardial infarction, stroke, pneumonia, respiratory failure, and death. The patient and her family understood these risks and wished to proceed with surgery.   PROCEDURE NOTE: The patient had been marked over the right hip with the word "yes" according to the hospital's right site protocol. She was brought to the operating room where she underwent a spinal anesthetic by the anesthesia service. The patient was then  placed in a left lateral decubitus position with the right hip facing up. She was held into position by a pegboard. All bony prominences were adequately padded. The patient was prepped and draped in a sterile fashion. A timeout was performed to verify the patient's name, date of birth, medical record number, correct site of surgery, and correct procedure to be performed. It was also used to verify the patient had received antibiotics and that all appropriate instruments, implants, and radiographic studies were available in the room. Once all in attendance were in agreement, the case began.   A curvilinear incision was made over the right hip based over the greater trochanter. The subcutaneous tissue was dissected using electrocautery. All bleeding vessels were cauterized during the exposure. The fascia lata was then identified and cleared of overlying subcutaneous tissue with a Art therapistKey elevator. A sharp deep #10 blade was used to make a curvilinear incision within the fascia lata. A Charnley hip retractor was placed to allow for better visualization of the hip joint. The hip bursa was carefully resected, revealing underlying external rotators. These were released from their posterior attachment to the greater trochanter and reflected posteriorly to allow protection of the sciatic nerve during the case. The piriformis and conjoined tendon were sutured with a #2 Tycron for later repair. A T-shaped capsulotomy was then performed in  the hip capsule, with both leaflets tagged with a #2 Tycron for later repair. The fracture was then easily identified. The femoral head was removed with a corkscrew device and measured to be 48 mm in diameter. An oscillating saw was then used to perform a proximal femoral osteotomy approximately one fingerbreadth above the  lesser trochanter. A femoral neck hip skid was then positioned under the femoral neck. A box osteotome was used to make the initial entry site into the femoral canal. A  single hand reamer was placed into the femoral canal, along with a femoral sounder to ensure that no cortical penetration had occurred during the hand reaming. Sequential broaches were then used in the femoral canal, starting with a size zero. The patient was found to have best medial and lateral fill of the femoral canal with a size 2 broach.   The trial broach was then left in position. A standard trial neck was placed on the femoral neck prosthesis along with a 48 mm head. The construct was then reduced in the hip joint, and the hip was taken through a full range of motion. It was deemed to be stable but had a little extra shuck. Therefore, a +4 head was placed onto the trial, and then the hip was re-reduced and taken through a full range of motion. This had excellent range of motion and stability and equivalent leg lengths. The trial components were then removed. The hip joint was copiously irrigated with a pulse lavage with GU-impregnated saline. The actual size 2 Stryker Accolade HFX stem was then gently malleted into position. The trial 48 mm head with a +4 inner head was then placed back on top of the trunnion and the hip was reduced. Again, the patient was found to have equivalent leg lengths, excellent range of motion and stability. The trial head was then removed. The actual Stryker Unitrax 48 mm outer head with a +4 neck adjustment sleeve was then opened and carefully malleted into position. The full hip construct was then reduced. Final range of motion and stability check was performed, and the patient had excellent stability and equivalent leg lengths. The hip joint was then copiously irrigated with pulse lavage. The hip capsule was closed with a #2 Tycron, and the hip capsule and piriformis tendon were performed using a soft tissue repair. Again, the joint was copiously irrigated. The fascia lata was closed with interrupted 0 Vicryl. Final irrigation of the wound was then performed. The subcutaneous  tissues were closed in 2 layers with 0 Vicryl and 2-0 Vicryl. The skin was approximated with staples. A dry sterile dressing was applied. The patient was then rolled onto her back for placement of an abduction pillow. Her leg lengths were equivalent. The patient was then transferred to a hospital bed in stable condition. I was scrubbed and present for the entire case, and all sharp and instrument counts were correct at the conclusion of the case.    ____________________________ Kathreen Devoid, MD klk:jcm D: 02/02/2013 15:18:56 ET T: 02/02/2013 16:21:11 ET JOB#: [id number]  cc: Kathreen Devoid, MD, <Dictator> Kathreen Devoid MD ELECTRONICALLY SIGNED 02/14/2013 22:29

## 2014-04-27 NOTE — Consult Note (Signed)
PATIENT NAME:  Katrina Gonzalez, Katrina Gonzalez MR#:  [id number] DATE OF BIRTH:  [birth year]  DATE OF CONSULTATION:  01/28/2013  CONSULTING PHYSICIAN:  Ruthe MannanPeter Kassidi Elza, MD  CHIEF COMPLAINT:  Right hip pain.  HISTORY OF PRESENT ILLNESS:  The patient is a 81 year old female who fell today while walking down the hallway. The patient lives with her daughter, Katrina Gonzalez, at home. The patient has a baseline advanced dementia. Is unable to tell the year, date, situation, president of [place], and does occasionally recognize her daughter. Her daughter is her primary caretaker. History is obtained primarily through the daughter. The patient is minimally interactive.  PAST MEDICAL HISTORY:  Positive for hypothyroidism. The patient has hypertension and advanced dementia. She is on medication for all of these.   MEDICATIONS:  Please see the electronic medical record.  ALLERGIES:  None.  FAMILY HISTORY:  Noncontributory.  REVIEW OF SYSTEMS:  A 12-point review of systems was negative.  PHYSICAL EXAMINATION:  GENERAL:  The patient is an age-appropriate appearing female, in no apparent distress. HEAD:  Atraumatic.  CHEST:  Equal and symmetric. ABDOMEN: Soft. EXTREMITIES:  Bilateral upper extremities and left lower extremity are without any evidence of trauma.   Right lower extremity:  The right lower extremity is shortened and externally rotated. There is some tenderness with any movement of the right lower extremity. The right lower extremity is neurovascularly intact.  SOCIAL HISTORY:  [place]. 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 She had 15 children, 12 of whom are still living. Her daughters #7 and #13 are here. Her power of attorney is Katrina Gonzalez, who is at the bedside.  DATA:  X-rays demonstrated right Garden IV femoral neck fracture with displacement.  ASSESSMENT AND PLAN:  A 81 year old female with a  displaced femoral neck fracture.   DISCUSSION:  I had a long discussion with the family at the bedside. I explained the nature of hip fractures in elderly. I explained the natural history, including a 20% chance of all-cause mortality at one year. They verbalized their understanding. I explained the operative rationale for treatment of her hip fracture. Will have the general medical service assist with co-management. I stated to the family in no uncertain terms that, given the patient's age and medical status, that she was extremely high risk for complications following this injury. I explained that she will likely need to go to a higher level of care to consist of a skilled nursing facility following hospital discharge. They verbalized their understanding.  Operative consent was signed. I marked the operative site. The right lower extremity was placed in [place] traction with 10 pounds was applied. I have notified Dr. Martha ClanKrasinski of this patient and he will assume care on 01/29/2013.  ____________________________ Ruthe MannanPeter Orval Dortch, MD pa:mr D: 01/28/2013 16:15:04 ET T: 01/28/2013 18:41:00 ET JOB#: [id number]  cc: Ruthe MannanPeter Mirage Pfefferkorn, MD, <Dictator> Althea GrimmerPeter J. Mickle PlumbApel, MD PhD Orthopaedic Surgery ELECTRONICALLY SIGNED 01/29/2013 14:14

## 2014-04-27 NOTE — Consult Note (Signed)
PATIENT NAME:  Katrina Gonzalez, Katrina Gonzalez MR#:  [id number] DATE OF BIRTH:  06-02-1920  CARDIOLOGY CONSULTATION   DATE OF CONSULTATION:  01/30/2013  CONSULTING PHYSICIAN:  Laurier NancyShaukat A. Katera Rybka, MD  INDICATION FOR THE CONSULTATION: Status post cardiac arrest and intubation and elevated troponin.   HISTORY OF PRESENT ILLNESS: This is a 81 year old African-American female who is currently intubated in ICU. History was given to me by nurses and the patient's daughters. Apparently, she was admitted after a fall and right hip fracture. She was doing very well on the floor after a right hip surgery by Dr. Martha ClanKrasinski, and all of a sudden, went out, and she required CPR  and intubation. It is being told that she may have had a seizure. I was asked to evaluate the patient because the patient had elevated troponin. Initially, the patient was admitted on 01/28/2013 for hip fracture after falling down, and she had medical problems, thus hospitalist service was on consultation.   PAST MEDICAL HISTORY: Dementia, hypothyroidism, hypertension, hyponatremia, hypokalemia and hyperlipidemia.   ALLERGIES: None.   USUAL MEDICATIONS: Aricept, Synthroid, Colace, Depakote.   SOCIAL HISTORY: Quit smoking 25 years ago. No history of EtOH abuse.   FAMILY HISTORY: Positive for hypertension.   PHYSICAL EXAMINATION:  GENERAL: She is alert and oriented x0.  VITALS: Her heart rate is 95, respirations 14, blood pressure is 105/60, she is afebrile.  NECK: Revealed no JVD.  LUNGS: Good air entry.  HEART: Regular rate and rhythm. Normal S1, S2.  ABDOMEN: Soft, nontender. Positive bowel sounds.  EXTREMITIES: No pedal edema.  NEUROLOGIC: She is alert and oriented x0.   LABORATORY DATA: Her white count was 9.1, H and H was 9.2/27.4. Her troponin is 0.31, MB fraction is 6.1. Initial troponin was 0.20. Her EKG shows sinus rhythm, right bundle branch block, left anterior fascicular block, nonspecific ST-T changes.   ASSESSMENT AND PLAN: The  patient has had cardiac arrest. It is unclear the etiology. After having hip surgery by Dr. Martha ClanKrasinski, she was apparently doing very well, talking, eating, and all of a sudden, had this event. She required cardiopulmonary resuscitation, 3 epinephrines and 200 joules of shock with cardioversion and was intubated, and right now, hemodynamically stable. She has mildly elevated troponin, most likely due to probably coronary artery disease, not having an acute myocardial infarction. Agree with giving aspirin and Lovenox, and will follow the patient closely with you. Discussed the plan with her daughters.  Thank you very much for the referral.   ____________________________ Laurier NancyShaukat A. Chandy Tarman, MD sak:lb D: 01/30/2013 08:43:00 ET T: 01/30/2013 09:24:36 ET JOB#: [id number]  cc: Laurier NancyShaukat A. Giulio Bertino, MD, <Dictator> Laurier NancySHAUKAT A Trixy Loyola MD ELECTRONICALLY SIGNED 02/16/2013 8:38

## 2014-04-27 NOTE — Consult Note (Signed)
Referring Physician:  Loletha Grayer :   Primary Care Physician:  Loletha Grayer : [facility], [facility], [address], [place], [place] [address], [phone]  Reason for Consult: Admit Date: 28-Jan-2013  Chief Complaint: witnessed seizure  Reason for Consult: seizure   History of Present Illness: History of Present Illness:   History obtained from chart review - no family at bedside  is a 81 year old female who was brought in after a fall, and was found to have a right hip fracture.  with moderate to severe dementia. She has had 2 recent falls. swallow.  found to have an elevated white count at 15.6 had code blue on 1/26 night - followed by possible seizure (gasping).   MEDICAL HISTORY:  SURGICAL HISTORY:  femoral neck fracture, fibroid removal, if this was a hysterectomy or not, cataract surgery.  No known drug allergies  10 mg daily, 5 mg daily, 8 mEq daily, and vitamin D 600 mg/800 international units daily, 100 mcg daily, 10 mg daily, B12 - 1000 mcg daily, 20 mg daily, 100 mg once or twice a day, 125 mg twice a day.  HISTORY: in the past 25 years, quit. did snuff in the past. No alcohol. No drug use. to work in a Special educational needs teacher. daughter lives with her.  HISTORY: for diabetes and hypertension in the family.    ROS:  Review of Systems   not obtainable  Past Medical/Surgical Hx:  Dementia:   Hyponatremia:   Hypokalemia:   hypothyroidism:   Hypercholesterolemia:   HTN:   Hip Surgery - Right:   Home Medications: Medication Instructions Last Modified Date/Time  Synthroid 100 mcg (0.1 mg) oral tablet 1 tab(s) orally once a day before breakfast. 25-Jan-15 16:51  Klor-Con 8 mEq oral tablet, extended release 1 tab(s) orally once a day in ate afternoon. 25-Jan-15 16:51  Namenda 10 mg oral tablet 1 tab(s) orally 2 times a day (after breakfast and at bedtime) 25-Jan-15 16:51  lovastatin 20 mg oral tablet 1 tab(s) orally once a day  (at bedtime) 25-Jan-15 16:51  donepezil 5 mg oral tablet 1 tab(s) orally once a day (at bedtime) 25-Jan-15 16:51  Caltrate 600 + D 600 mg-800 intl units oral tablet 1 tab(s) orally once a day in the afternoon. 25-Jan-15 16:51  quinapril 10 mg oral tablet 1 tab(s) orally once a day before breakfast 25-Jan-15 16:51  Vitamin B12 1000 mcg oral tablet 1 tab(s) orally once a day in the afternoon. 25-Jan-15 16:51  Colace sodium 100 mg oral capsule 1 cap(s) orally once a day (in the morning) 25-Jan-15 16:51  divalproex sodium 125 mg oral delayed release capsule 1 cap(s) orally 2 times a day (after breakfast and at bedtime) 25-Jan-15 16:51   Allergies:  No Known Allergies:   Vital Signs:  **Vital Signs.:   27-Jan-15 11:00  Vital Signs Type Routine  Temperature Source oral  Pulse Pulse 78  Pulse source if not from Vital Sign Device per cardiac monitor  Respirations Respirations 14  Systolic BP Systolic BP 620  Diastolic BP (mmHg) Diastolic BP (mmHg) 53  Mean BP 71  Pulse Ox % Pulse Ox % 100  Oxygen Delivery Ventilator Assisted  Pulse Ox Heart Rate 78    19:00  Vital Signs Type Routine  Temperature Temperature (F) 98.3  Celsius 36.8  Temperature Source oral  Pulse Pulse 86  Pulse source if not from Vital Sign Device per cardiac monitor  Respirations Respirations 13  Systolic BP Systolic BP  440  Diastolic BP (mmHg) Diastolic BP (mmHg) 51  Mean BP 71  Pulse Ox % Pulse Ox % 98  Oxygen Delivery Ventilator Assisted  Pulse Ox Heart Rate 86   EXAM: elderly pt, intubated, foley catheter, IV lines, cardiac and respiratory monitor attached.  Heart - s1, s2 heart sounds, lungs - decreased air entry belly soft carotids no bruit  Neurological exam Mental status - intubated, unresponsive to verbal stimulation and physical stimulation,  - can't check higher cortical functions (e.g. language, memory, attention etc)  - pupils minimally reactive (left cataract, right IOL), minimal  extra-occular movements, no response to visal threat, can't do fundoscopic exam, face symmetric (grimace with suction),  can't check hearing reliablly +ve gag reflex  can't check tongue position/protrusion reliabllay  Motor/sensory generalized hypotonia, withdraws to pain minimally in all 4 extremities  reflexes - symmetric  can't check gait, co-ordination or other parts of neuro exam..  Lab Results:  Hepatic:  25-Jan-15 15:20   Bilirubin, Total 0.5  Alkaline Phosphatase 101 (45-117 NOTE: New Reference Range 11/24/12)  SGPT (ALT) 15  SGOT (AST) 28  Total Protein, Serum 6.5  Albumin, Serum  3.0  Routine BB:  25-Jan-15 15:50   ABO Group + Rh Type A Positive  Antibody Screen NEGATIVE (Result(s) reported on 28 Jan 2013 at 04:44PM.)  Routine Micro:  25-Jan-15 16:14   Micro Text Report URINE CULTURE   COMMENT                   MIXED BACTERIAL ORGANISMS   COMMENT                   RESULTS SUGGESTIVE OF CONTAMINATION   COMMENT                   SUGGEST RECOLLECTION   ANTIBIOTIC                       Specimen Source INDWELLING CATHETER  Culture Comment MIXED BACTERIAL ORGANISMS  Culture Comment . RESULTS SUGGESTIVE OF CONTAMINATION  Culture Comment    . SUGGEST RECOLLECTION  Result(s) reported on 30 Jan 2013 at 11:00AM.  Routine Chem:  27-Jan-15 05:23   Glucose, Serum  145  BUN  20  Creatinine (comp) 0.95  Sodium, Serum  131  Potassium, Serum 3.5  Chloride, Serum 98  CO2, Serum 24  Calcium (Total), Serum  7.6  Anion Gap 9  Osmolality (calc) 268  eGFR (African American) >60  eGFR (Non-African American)  53 (eGFR values <25m/min/1.73 m2 may be an indication of chronic kidney disease (CKD). Calculated eGFR is useful in patients with stable renal function. The eGFR calculation will not be reliable in acutely ill patients when serum creatinine is changing rapidly. It is not useful in  patients on dialysis. The eGFR calculation may not be applicable to patients at the  low and high extremes of body sizes, pregnant women, and vegetarians.)    10:02   Result Comment Troponin - This specimen was collected through an   - indwelling catheter or arterial line.  - A minimum of 517m of blood was wasted prior    - to collecting the sample.  Interpret  - results with caution.  - RESULTS VERIFIED BY REPEAT TESTING.  - Elevated troponin previously called @  - 0210 01/30/13 by [facility] - [facility].  Result(s) reported on 30 Jan 2013 at 10:48AM.  Cardiac:  27-Jan-15 10:02   CPK-MB, Serum  5.1 (Result(s) reported  on 30 Jan 2013 at 10:49AM.)  Troponin I  0.25 (0.00-0.05 0.05 ng/mL or less: NEGATIVE  Repeat testing in 3-6 hrs  if clinically indicated. >0.05 ng/mL: POTENTIAL  MYOCARDIAL INJURY. Repeat  testing in 3-6 hrs if  clinically indicated. NOTE: An increase or decrease  of 30% or more on serial  testing suggests a  clinically important change)  Routine UA:  25-Jan-15 16:14   Color (UA) Yellow  Clarity (UA) Hazy  Glucose (UA) Negative  Bilirubin (UA) Negative  Ketones (UA) Trace  Specific Gravity (UA) 1.015  Blood (UA) Negative  pH (UA) 7.0  Protein (UA) Negative  Nitrite (UA) Negative  Leukocyte Esterase (UA) 1+ (Result(s) reported on 28 Jan 2013 at 04:27PM.)  RBC (UA) 2 /HPF  WBC (UA) 3 /HPF  Bacteria (UA) NONE SEEN  Epithelial Cells (UA) 3 /HPF  Mucous (UA) PRESENT (Result(s) reported on 28 Jan 2013 at 04:27PM.)  Routine Coag:  25-Jan-15 21:06   Prothrombin 13.4  INR 1.0 (INR reference interval applies to patients on anticoagulant therapy. A single INR therapeutic range for coumarins is not optimal for all indications; however, the suggested range for most indications is 2.0 - 3.0. Exceptions to the INR Reference Range may include: Prosthetic heart valves, acute myocardial infarction, prevention of myocardial infarction, and combinations of aspirin and anticoagulant. The need for a higher or lower target INR must be assessed  individually. Reference: The Pharmacology and Management of the Vitamin K  antagonists: the seventh ACCP Conference on Antithrombotic and Thrombolytic Therapy. BOFBP.1025 Sept:126 (3suppl): [id number]. A HCT value >55% may artifactually increase the PT.  In one study,  the increase was an average of 25%. Reference:  "Effect on Routine and Special Coagulation Testing Values of Citrate Anticoagulant Adjustment in Patients with High HCT Values." American Journal of Clinical Pathology 2006;126:400-405.)  Activated PTT (APTT) 23.6 (A HCT value >55% may artifactually increase the APTT. In one study, the increase was an average of 19%. Reference: "Effect on Routine and Special Coagulation Testing Values of Citrate Anticoagulant Adjustment in Patients with High HCT Values." American Journal of Clinical Pathology 2006;126:400-405.)  Routine Hem:  27-Jan-15 05:23   WBC (CBC) 9.1  RBC (CBC)  2.83  Hemoglobin (CBC)  9.2  Hematocrit (CBC)  27.4  Platelet Count (CBC)  142  MCV 97  MCH 32.5  MCHC 33.7  RDW 13.2  Neutrophil % 69.4  Lymphocyte % 17.0  Monocyte % 13.2  Eosinophil % 0.1  Basophil % 0.3  Neutrophil # 6.3  Lymphocyte # 1.6  Monocyte #  1.2  Eosinophil # 0.0  Basophil # 0.0   Radiology Results: CT:    27-Jan-15 03:06, CT Head Without Contrast  CT Head Without Contrast   REASON FOR EXAM:    unresponsiveness  COMMENTS:       PROCEDURE: CT  - CT HEAD WITHOUT CONTRAST  - Jan 30 2013  3:06AM     CLINICAL DATA:  Code blue after hip surgery.    EXAM:  CT HEAD WITHOUT CONTRAST    TECHNIQUE:  Contiguous axial images were obtained from the base of the skull  through the vertex without intravenous contrast.    COMPARISON:  CT of the head October 10, 2012.  FINDINGS:  The ventricles and sulci are normal for age. No intraparenchymal  hemorrhage, mass effect nor midline shift.Confluent supratentorial  white matter hypodensities are within normal range for patient's age  and  though non-specific suggest sequelae of chronic small vessel  ischemic disease. No  acute large vascular territory infarcts. Left  occipital cortical to subcortical encephalomalacia.    No abnormal extra-axial fluid collections. Basal cisterns are  patent. Moderate calcific atherosclerosis of the carotid siphons.    No skull fracture. Visualized paranasal sinuses and mastoid aircells  are well-aerated. The included ocular globes and orbital contents  are non-suspicious. Fullness of the nasopharyngeal soft tissues may  be related to life support lines.   IMPRESSION:  No acute intracranial process.    Stable appearance of the head: Involutional changes. Severe white  matter changes suggest chronic small vessel ischemic disease.  Suspected remote small left posterior cerebral artery territory  infarct.      Electronically Signed    By: Elon Alas    On: 01/30/2013 03:36         Verified By: Ricky Ala, M.D.,   Radiology Impression: Radiology Impression: CT head - extensive WM microvascular ischemic changes, old left parieto-occipital infract, bil hippocampal atrophy   Impression/Recommendations: Recommendations:   1) Possible seizure - before pt was coded - no h/o epilepsy - should be considered provoked seizure (if it was a seizure).no need for EEG (will be low yield)CT (as above)will hold off starting any Anti Epileptic Drug for now. (valproic acid might have been used for behavioral reasons) Dementia - on aricept and dementia - based on history sounds like pt has atleast moderate to severe dementia.conti aricept and namenda for nowbil hippocampal atrophy (spoorts the diagnosis of dementia) Extensive WM disease and old left parieto-occipital infract. Prognosis - poor over all. Pt is planned to have terminal extubation tomorrow. She may survive this as it was brief code and she has some responsiveness today on exam. I agree with plan of extubation. will  follow.  Electronic Signatures: Ray Church (MD)  (Signed 27-Jan-15 20:36)  Authored: REFERRING PHYSICIAN, Primary Care Physician, Consult, History of Present Illness, Review of Systems, PAST MEDICAL/SURGICAL HISTORY, HOME MEDICATIONS, ALLERGIES, NURSING VITAL SIGNS, Physical Exam-, LAB RESULTS, RADIOLOGY RESULTS, Recommendations   Last Updated: 27-Jan-15 20:36 by Ray Church (MD)

## 2014-04-27 NOTE — Consult Note (Signed)
Brief Consult Note: Diagnosis: Cardiac arrest.   Patient was seen by consultant.   Consult note dictated.   Recommend further assessment or treatment.  Electronic Signatures: Radene KneeKhan, Sury Wentworth Ali (MD)  (Signed 27-Jan-15 08:44)  Authored: Brief Consult Note   Last Updated: 27-Jan-15 08:44 by Radene KneeKhan, Rashada Klontz Ali (MD)

## 2014-04-27 NOTE — H&P (Signed)
PATIENT NAME:  Katrina Gonzalez, Havanna MR#:  [id number] DATE OF BIRTH:  1920-08-29  DATE OF ADMISSION:  01/28/2013  PRIMARY CARE PHYSICIAN:  Dr. Darreld McleanLinda Miles  CHIEF COMPLAINT: Brought in after a fall.   HISTORY OF PRESENT ILLNESS: This is a 81 year old female who was brought in after a fall, and was found to have a right hip fracture. The patient is unable to give much history at all secondary to dementia. As per the daughter, who lives with her, patient was sitting on the edge of the bed. She got dressed. She put the walker in front of her, and told her to come out to the kitchen and walk. The patient was a little more confused. The walker fell over, and the patient fell over with it. Normally the patient is able to walk with the walker, and get from the bedroom to the kitchen and to the bathroom. Recently, over the past few months, the patient has had a mood change and has become more combative and very stubborn. She has had 2 recent falls. As per the daughter, the patient also had some coughing episode after a peanut butter cracker. She has also had a lot of sinus drainage recently. In the ER, she was found to have an elevated white count at 15.6, a chest x-ray that showed right midlung hazy airspace disease. Hospitalist services were contacted for further evaluation.   PAST MEDICAL HISTORY: Dementia, hypothyroidism, hypertension, hyponatremia, hypokalemia, and hyperlipidemia.   PAST SURGICAL HISTORY:  Left femoral neck fracture, fibroid removal, unclear if this was a hysterectomy or not, left cataract surgery.   ALLERGIES: No known drug allergies   MEDICATIONS: Include Namenda 10 mg daily, Aricept 5 mg daily, potassium, Klor-Con 8 mEq daily, calcium and vitamin D 600 mg/800 international units daily, Synthroid 100 mcg daily, quinapril 10 mg daily, B12 - 1000 mcg daily, lovastatin 20 mg daily, Colace 100 mg once or twice a day, Depakote 125 mg twice a day.   SOCIAL HISTORY: Smoker in the past 25 years,  quit. Occasionally did snuff in the past. No alcohol. No drug use. 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 Currently daughter lives with her.   FAMILY HISTORY: Significant for diabetes and hypertension in the family.   REVIEW OF SYSTEMS: Unable to obtain secondary to dementia.   PHYSICAL EXAMINATION: VITAL SIGNS: On presentation, temperature 97.8, pulse 65, respirations 18, blood pressure 169/72, pulse ox 100% on room air.  GENERAL: No respiratory distress, lying on her left side.  HEENT: Eyes: Conjunctivae and lids normal. Pupils equal, round, and reactive to light. Extraocular muscles are not tested secondary to dementia. Nasal mucosa: No erythema. Throat: No erythema, no exudate seen. Lips and gums: No lesions.  NECK: No JVD. No bruits. No lymphadenopathy. No thyromegaly. No thyroid nodules palpated.  RESPIRATORY: Positive rhonchi, right mid lung. Slight expiratory wheeze. Left lung clear.  CARDIOVASCULAR SYSTEM: S1, S2 normal. No gallops, rubs, or murmurs heard. Carotid upstroke 2+ bilaterally. No bruits. Dorsalis pedis pulses 1+ bilaterally. Trace edema of the lower extremity.  ABDOMEN: Soft, nontender. No organomegaly/splenomegaly. Normoactive bowel sounds. No masses felt.  LYMPHATIC: No lymph nodes in the neck.  MUSCULOSKELETAL: No clubbing. Trace edema. No cyanosis.  SKIN: Chronic lower extremity discoloration.  PSYCHIATRIC: The patient is alert to person.   LABORATORY AND RADIOLOGICAL DATA: Chest x-ray shows right midlung hazy airspace disease. Right hip shows acute right femoral neck fracture. Glucose 87, BUN 13, creatinine 0.87, sodium 134, potassium 3.5, chloride 102, CO2 of 26,  calcium 9.0. Liver function tests:  Normal range. White blood cell count 15.6, H and H 12.0 and 36.3, platelet count of 170. Urinalysis:  1+ leukocyte esterase. EKG shows sinus rhythm, left anterior fascicular block.   ASSESSMENT AND PLAN: 1.  Hip fracture. No contraindications to surgery at this time. Mortality  within the first year of hip fracture is very high, especially if she does not get up and walk again. Family willing to take the risk and go to surgery.   2.  Possible pneumonia on chest x-ray, with leukocytosis. Will give a dose of Levaquin stat now, and daily.   3.  Dementia. Continue Aricept and Namenda.   4.  Hypothyroidism. Continue Synthroid.   5.  Hyperlipidemia. Continue lovastatin.   6.  Hypertension. Continue quinapril.  Time spent on admission:  55 minutes.   The patient is a FULL CODE.    ____________________________ [name]. [name], MD rjw:mr D: 01/28/2013 16:51:37 ET T: 01/28/2013 18:11:18 ET JOB#: [id number]  cc: [name]. [name], MD, <Dictator> [name], MD [facility]   Salley Scarlet MD ELECTRONICALLY SIGNED 02/02/2013 15:25

## 2014-04-27 NOTE — Discharge Summary (Signed)
PATIENT NAME:  Katrina BroodFULLER, Katrina Gonzalez MR#:  [id number] DATE OF BIRTH:  1920-04-02  DATE OF ADMISSION:  01/28/2013 DATE OF DISCHARGE:  02/06/2013  DISCHARGE DIAGNOSES: 1. Right hip fracture, status post surgery. Follow with orthopedics in 10 days. 2. Status post cardiac arrest and encephalopathy secondary to seizure, recovered.  3. Hypotension and decreased urine output as a result of cardiac arrest for 2 to 3 days, recovered renal function.  4. Weakness.  5. Blood loss anemia due to surgery, status post 2 units packed red blood cell transfusion. Hemoglobin stable around 8.   CODE STATUS ON DISCHARGE: Do not resuscitate.   MEDICATIONS ON DISCHARGE:  1. Synthroid 100 mcg oral tablet once a day.  2. Namenda 10 mg oral tablet 2 times a day.  3. Lovastatin 20 mg oral tablet once a day.  4. Calcium and vitamin D tablet once a day.  5. Quinapril 10 mg oral tablet once a day.  6. Vitamin B12 1000 mcg oral tablet once a day.  7. Colace 100 mg oral capsule once a day.  8. Potassium chloride 10 mEq once a day.  9. Acetaminophen 325 mg oral tablet every 4 hours as needed.  10. Magnesium hydroxide 30 mL once a day as needed for constipation.  11. Lovenox 30 mg subcutaneous 2 times a day.  12. Ferrous sulfate 220 mg per 5 mL give 7.5 mL oral 2 times a day with meals.  13. Valproic acid 2.5 mL oral syrup every 12 hours.  14. Ensure Plus 2 times a day.  15. Ferrous sulfate 325 mg oral tablet 3 times a day.   DIET ON DISCHARGE: Regular. Diet consistency: Regular and advised to have moistened and flavored food.   ACTIVITY: As tolerated.   TIMEFRAME TO FOLLOWUP: Within 1 to 2 weeks with orthopedic clinic and to have CBC checked with this visit.   HISTORY OF PRESENTING ILLNESS: A 81 year old female was brought in after a fall and was found to have right hip fracture. Was unable to give much history due to dementia. The patient was sitting at the edge of the bed, she got dressed, put her walker in front. The  walker fell over, and the patient fell on the floor. The patient was also becoming somewhat stubborn because of her age for the last few months, and after coming to ER, she was found having elevated white cell count with mid lung hazy airspace disease, with fractured hip. So, she was admitted to medical service.  HOSPITAL COURSE AND STAY:  1. Hip replacement surgery was done by orthopedics, Dr. Martha ClanKrasinski, on 26th of January, and after the surgery, she had an episode of possible gasping in the presence of daughter and some seizurelike activity, and after this, she had a cardiac arrest, so CPR was started. She was intubated and had recovery of her pulse very soon and so placed in Critical Care Unit. After this episode of cardiac arrest, the workup was done to find out the cause for that, but it all remained negative, and so finally we attributed it to having an episode of seizure as there was some observed seizure by her daughter, who was present in the room at that time. Started her on Keppra for that, as per recommendation by neurology. Cardiology consult was called in. Restrictive pattern of echocardiogram, ejection fraction 55%, and they suggested no new intervention at this time. The patient also had cardiogenic shock immediately after the cardiac arrest. She was on Levophed drip, but then blood pressure  became stable, and she was able to come off within the next 1 or 2 days. The patient overall remained stable after extubation but generalized very weak and somewhat lethargic, so finally we discharged her to rehab to recover properly after a few days.   Other medical issues: 2. Right hip fracture. Surgery was done on the 26th of January, and after that, remained stable with some secretions, but orthopedics cleared and advised to follow in clinic after 10 days of discharge.  3. Blood loss anemia, status post surgery. She received 2 units of transfusion on the 29th and 31st of January. Likely it was due to  postsurgical loss, and hemoglobin remained stable after that at around 8.  4. Suspected pneumonia. It was suspected on admission, but the patient was on Levaquin. There are no signs of infection, and the patient remained stable, so we stopped antibiotics, and she remained stable.  5. Hypothyroidism. Continued on Synthroid.  6. Hypertension. Medication was on hold because of hypotension, and she remained stable.  7. Decreased urine output. This was present after 1 or 2 days of cardiac arrest episode and hypotension. Most likely it was due to decreased perfusion and hypotension. After blood pressure became stable, her kidney function remained stable in the hospital.   CONSULTATIONS IN THE HOSPITAL:  1. Orthopedic consult with Dr. Juanell Fairly. 2. Cardiology consult with Dr. Laurier Nancy. 3. Palliative care consult with Dr. Harriett Sine Phifer. 4. Surgical consult with Dr. Juliann Pulse. 5. Critical care consult with Dr. Erin Fulling.   IMPORTANT LABORATORY RESULTS IN THE HOSPITAL:  X-ray of right hip: Acute right femoral neck fracture.  Chest x-ray, PA, 1-view on admission: Right midlung hazy airspace disease.  Creatinine was 0.87 on admission. White cell count 15.6 and hemoglobin was 12 on admission. Urinalysis was negative. Urine culture: Mixed bacterial organism contamination.  CT head without contrast was done because of episode of seizure and cardiac arrest: No acute intracranial process.  Troponin went up to 0.25.  Echocardiogram showed left ventricular ejection fraction 55% to 60%, low-normal global left ventricular systolic function, elevated mean left atrial pressure, impaired relaxation pattern of left ventricular diastolic filling.  Hemoglobin dropped to 7.3 on 29th, came up to 8 after transfusion, and on 31st again dropped to 7.5. We transfused 1 more unit and came up to 8.7. Blood culture remained negative, which was collected on 1st of February. Hemoglobin stable at 8.3 on the day of  discharge.   TOTAL TIME SPENT ON THIS DISCHARGE: 40 minutes.   ____________________________ Hope Pigeon Elisabeth Pigeon, MD vgv:lb D: 02/09/2013 11:36:42 ET T: 02/09/2013 12:09:01 ET JOB#: [id number]  cc: Hope Pigeon. Elisabeth Pigeon, MD, <Dictator> Kathreen Devoid, MD [name], MD Altamese Dilling MD ELECTRONICALLY SIGNED 02/24/2013 8:37

## 2014-04-27 NOTE — Op Note (Signed)
PATIENT NAME:  Katrina Gonzalez, Shakeira MR#:  [id number] DATE OF BIRTH:  [birth year]  DATE OF PROCEDURE:  01/30/2013  PREOPERATIVE DIAGNOSIS: Hypotension, need for vasopressors.   POSTOPERATIVE DIAGNOSIS: Hypotension, need for vasopressors.   PROCEDURE PERFORMED: Ultrasound-guided right internal jugular vein central venous catheter placement.   SURGEON: Ida Roguehristopher Odessie Polzin, MD.  ESTIMATED BLOOD LOSS: 5 mL.  COMPLICATIONS: None.   SPECIMENS: None.   ANESTHESIA: None.   INDICATION FOR SURGERY: Ms. Toni ArthursFuller is a pleasant 81 year old female, who was found unresponsive following a hip repair earlier today. She was requiring vasopressor medications. I was thus consulted for central venous catheter placement.   DETAILS OF PROCEDURE: Informed consent was obtained. Ms. Toni ArthursFuller was laid supine on her ICU bed and her right neck was prepped and draped in standard surgical fashion. A timeout was then performed correctly identifying the patient name, operative site and procedure to be performed. The ultrasound was placed over her right neck. Her jugular was patent, but easily collapsible. I was able to access it with 1 stick and, however with two attempts, I was unable to wire the internal jugular vein. Upon repositioning, I inadvertently decannulated her jugular. I thus accessed it again and placed the wire through the catheter. The wire now advanced pretty well, but did hit some resistance after a long distance, therefore I proceeded to not push any further against resistance. I then removed the needle. The tract was then dilated up. The triple-lumen catheter was placed over the wire. The initial catheter did not flush or draw, therefore I did back it up slightly and it then flushed and drew quite easily. It was sutured in place. A sterile dressing was placed over the central venous catheter entry site. The postprocedure chest x-ray showed no pneumothorax and catheter in correct position. It was suitable to use. There  were no immediate complications. Needle, sponge, and instrument counts were correct at the end of the procedure.   ____________________________ Si Raiderhristopher A. Mamoru Takeshita, MD cal:aw D: 01/30/2013 00:23:59 ET T: 01/30/2013 06:42:06 ET JOB#: [id number]  cc: Cristal Deerhristopher A. Javon Snee, MD, <Dictator> Jarvis NewcomerHRISTOPHER A Brendaly Townsel MD ELECTRONICALLY SIGNED 01/30/2013 19:25

## 2014-10-07 ENCOUNTER — Encounter: Payer: Self-pay | Admitting: *Deleted

## 2014-10-07 ENCOUNTER — Inpatient Hospital Stay
Admission: EM | Admit: 2014-10-07 | Discharge: 2014-10-08 | DRG: 871 | Disposition: A | Discharge status: Home / Self Care | Payer: Medicare PPO | Attending: Internal Medicine | Admitting: Internal Medicine

## 2014-10-07 ENCOUNTER — Emergency Department: Payer: Medicare PPO

## 2014-10-07 DIAGNOSIS — Z8673 Personal history of transient ischemic attack (TIA), and cerebral infarction without residual deficits: Secondary | ICD-10-CM

## 2014-10-07 DIAGNOSIS — I10 Essential (primary) hypertension: Secondary | ICD-10-CM | POA: Diagnosis present

## 2014-10-07 DIAGNOSIS — I2510 Atherosclerotic heart disease of native coronary artery without angina pectoris: Secondary | ICD-10-CM | POA: Diagnosis present

## 2014-10-07 DIAGNOSIS — Z7401 Bed confinement status: Secondary | ICD-10-CM | POA: Diagnosis not present

## 2014-10-07 DIAGNOSIS — B9620 Unspecified Escherichia coli [E. coli] as the cause of diseases classified elsewhere: Secondary | ICD-10-CM | POA: Diagnosis present

## 2014-10-07 DIAGNOSIS — K921 Melena: Secondary | ICD-10-CM | POA: Diagnosis present

## 2014-10-07 DIAGNOSIS — F0390 Unspecified dementia without behavioral disturbance: Secondary | ICD-10-CM | POA: Diagnosis present

## 2014-10-07 DIAGNOSIS — G40909 Epilepsy, unspecified, not intractable, without status epilepticus: Secondary | ICD-10-CM | POA: Diagnosis present

## 2014-10-07 DIAGNOSIS — N390 Urinary tract infection, site not specified: Secondary | ICD-10-CM | POA: Diagnosis present

## 2014-10-07 DIAGNOSIS — Z90710 Acquired absence of both cervix and uterus: Secondary | ICD-10-CM | POA: Diagnosis not present

## 2014-10-07 DIAGNOSIS — R410 Disorientation, unspecified: Secondary | ICD-10-CM | POA: Diagnosis present

## 2014-10-07 DIAGNOSIS — Z9889 Other specified postprocedural states: Secondary | ICD-10-CM

## 2014-10-07 DIAGNOSIS — Z8249 Family history of ischemic heart disease and other diseases of the circulatory system: Secondary | ICD-10-CM | POA: Diagnosis not present

## 2014-10-07 DIAGNOSIS — G9340 Encephalopathy, unspecified: Secondary | ICD-10-CM | POA: Diagnosis present

## 2014-10-07 DIAGNOSIS — A419 Sepsis, unspecified organism: Principal | ICD-10-CM | POA: Diagnosis present

## 2014-10-07 DIAGNOSIS — R569 Unspecified convulsions: Secondary | ICD-10-CM

## 2014-10-07 DIAGNOSIS — Z66 Do not resuscitate: Secondary | ICD-10-CM | POA: Diagnosis present

## 2014-10-07 DIAGNOSIS — R4182 Altered mental status, unspecified: Secondary | ICD-10-CM

## 2014-10-07 HISTORY — DX: Essential (primary) hypertension: I10

## 2014-10-07 HISTORY — DX: Unspecified convulsions: R56.9

## 2014-10-07 HISTORY — DX: Disorder of thyroid, unspecified: E07.9

## 2014-10-07 HISTORY — DX: Cerebral infarction, unspecified: I63.9

## 2014-10-07 HISTORY — DX: Atherosclerotic heart disease of native coronary artery without angina pectoris: I25.10

## 2014-10-07 LAB — COMPREHENSIVE METABOLIC PANEL
ALBUMIN: 3.4 g/dL — AB (ref 3.5–5.0)
ALK PHOS: 52 U/L (ref 38–126)
ALT: 23 U/L (ref 14–54)
AST: 37 U/L (ref 15–41)
Anion gap: 7 (ref 5–15)
BILIRUBIN TOTAL: 0.9 mg/dL (ref 0.3–1.2)
BUN: 16 mg/dL (ref 6–20)
CALCIUM: 8.5 mg/dL — AB (ref 8.9–10.3)
CO2: 25 mmol/L (ref 22–32)
Chloride: 98 mmol/L — ABNORMAL LOW (ref 101–111)
Creatinine, Ser: 0.73 mg/dL (ref 0.44–1.00)
GFR calc Af Amer: >60 mL/min (ref >60)
GLUCOSE: 130 mg/dL — AB (ref 65–99)
Potassium: 4 mmol/L (ref 3.5–5.1)
Sodium: 130 mmol/L — ABNORMAL LOW (ref 135–145)
TOTAL PROTEIN: 6.3 g/dL — AB (ref 6.5–8.1)

## 2014-10-07 LAB — CBC WITH DIFFERENTIAL/PLATELET
BASOS ABS: 0.1 10*3/uL (ref 0–0.1)
BASOS PCT: 1 %
EOS PCT: 1 %
Eosinophils Absolute: 0.1 10*3/uL (ref 0–0.7)
HEMATOCRIT: 38.4 % (ref 35.0–47.0)
HEMOGLOBIN: 13 g/dL (ref 12.0–16.0)
Lymphocytes Relative: 35 %
Lymphs Abs: 4.6 10*3/uL — ABNORMAL HIGH (ref 1.0–3.6)
MCH: 34.2 pg — AB (ref 26.0–34.0)
MCHC: 33.9 g/dL (ref 32.0–36.0)
MCV: 100.8 fL — AB (ref 80.0–100.0)
Monocytes Absolute: 0.7 10*3/uL (ref 0.2–0.9)
Monocytes Relative: 6 %
Neutro Abs: 7.7 10*3/uL — ABNORMAL HIGH (ref 1.4–6.5)
Neutrophils Relative %: 57 %
Platelets: 129 10*3/uL — ABNORMAL LOW (ref 150–440)
RBC: 3.81 MIL/uL (ref 3.80–5.20)
RDW: 12.9 % (ref 11.5–14.5)
WBC: 13.3 10*3/uL — ABNORMAL HIGH (ref 3.6–11.0)

## 2014-10-07 LAB — URINALYSIS COMPLETE WITH MICROSCOPIC (ARMC ONLY)
Bilirubin Urine: NEGATIVE
Glucose, UA: NEGATIVE mg/dL
Hgb urine dipstick: NEGATIVE
KETONES UR: NEGATIVE mg/dL
Nitrite: NEGATIVE
PH: 6 (ref 5.0–8.0)
PROTEIN: 30 mg/dL — AB
SPECIFIC GRAVITY, URINE: 1.012 (ref 1.005–1.030)
Squamous Epithelial / LPF: NONE SEEN

## 2014-10-07 LAB — C DIFFICILE QUICK SCREEN W PCR REFLEX
C DIFFICLE (CDIFF) ANTIGEN: NEGATIVE
C Diff interpretation: NEGATIVE
C Diff toxin: NEGATIVE

## 2014-10-07 LAB — OCCULT BLOOD X 1 CARD TO LAB, STOOL: FECAL OCCULT BLD: POSITIVE — AB

## 2014-10-07 LAB — LACTIC ACID, PLASMA
LACTIC ACID, VENOUS: 3.9 mmol/L — AB (ref 0.5–2.0)
Lactic Acid, Venous: 4.5 mmol/L (ref 0.5–2.0)

## 2014-10-07 LAB — TROPONIN I

## 2014-10-07 MED ORDER — INFLUENZA VAC SPLIT QUAD 0.5 ML IM SUSY: 0.5000 mL | PREFILLED_SYRINGE | INTRAMUSCULAR | Status: DC

## 2014-10-07 MED ORDER — POTASSIUM CHLORIDE CRYS ER 10 MEQ PO TBCR
10.0000 meq | EXTENDED_RELEASE_TABLET | Freq: Every day | ORAL | Status: DC
  Administered 2014-10-07 – 2014-10-08 (×2): 10 meq via ORAL
  Filled 2014-10-07 (×2): qty 1

## 2014-10-07 MED ORDER — DIVALPROEX SODIUM 125 MG PO CSDR
125.0000 mg | DELAYED_RELEASE_CAPSULE | Freq: Three times a day (TID) | ORAL | Status: DC
  Administered 2014-10-07 – 2014-10-08 (×2): 125 mg via ORAL
  Filled 2014-10-07 (×3): qty 1

## 2014-10-07 MED ORDER — DEXTROSE 5 % IV SOLN
2.0000 g | INTRAVENOUS | Status: DC
  Filled 2014-10-07: qty 2

## 2014-10-07 MED ORDER — DEXTROSE 5 % IV SOLN
1.0000 g | INTRAVENOUS | Status: AC
  Administered 2014-10-07: 1 g via INTRAVENOUS
  Filled 2014-10-07: qty 10

## 2014-10-07 MED ORDER — DIVALPROEX SODIUM 125 MG PO CSDR
125.0000 mg | DELAYED_RELEASE_CAPSULE | Freq: Two times a day (BID) | ORAL | Status: DC
  Administered 2014-10-07: 125 mg via ORAL
  Filled 2014-10-07 (×2): qty 1

## 2014-10-07 MED ORDER — SODIUM CHLORIDE 0.9 % IV SOLN
INTRAVENOUS | Status: DC
Start: 2014-10-07 — End: 2014-10-07

## 2014-10-07 MED ORDER — SODIUM CHLORIDE 0.9 % IV SOLN
INTRAVENOUS | Status: DC
  Administered 2014-10-07 – 2014-10-08 (×3): via INTRAVENOUS

## 2014-10-07 MED ORDER — ONDANSETRON HCL 4 MG PO TABS: 4.0000 mg | ORAL_TABLET | Freq: Four times a day (QID) | ORAL | Status: DC | PRN

## 2014-10-07 MED ORDER — PRAVASTATIN SODIUM 20 MG PO TABS
20.0000 mg | ORAL_TABLET | Freq: Every day | ORAL | Status: DC
  Administered 2014-10-07: 20 mg via ORAL
  Filled 2014-10-07: qty 1

## 2014-10-07 MED ORDER — LISINOPRIL 10 MG PO TABS
10.0000 mg | ORAL_TABLET | Freq: Every day | ORAL | Status: DC
  Administered 2014-10-07 – 2014-10-08 (×2): 10 mg via ORAL
  Filled 2014-10-07 (×2): qty 1

## 2014-10-07 MED ORDER — ASPIRIN EC 81 MG PO TBEC
81.0000 mg | DELAYED_RELEASE_TABLET | Freq: Every day | ORAL | Status: DC
  Administered 2014-10-08: 81 mg via ORAL
  Filled 2014-10-07 (×2): qty 1

## 2014-10-07 MED ORDER — LEVOTHYROXINE SODIUM 100 MCG PO TABS
100.0000 ug | ORAL_TABLET | Freq: Every day | ORAL | Status: DC
  Administered 2014-10-08: 100 ug via ORAL
  Filled 2014-10-07: qty 1

## 2014-10-07 MED ORDER — SODIUM CHLORIDE 0.9 % IV BOLUS (SEPSIS)
500.0000 mL | INTRAVENOUS | Status: AC
  Administered 2014-10-07: 100 mL via INTRAVENOUS

## 2014-10-07 MED ORDER — ACETAMINOPHEN 325 MG PO TABS: 650.0000 mg | ORAL_TABLET | Freq: Four times a day (QID) | ORAL | Status: DC | PRN

## 2014-10-07 MED ORDER — ONDANSETRON HCL 4 MG/2ML IJ SOLN: 4.0000 mg | Freq: Four times a day (QID) | INTRAMUSCULAR | Status: DC | PRN

## 2014-10-07 MED ORDER — SODIUM CHLORIDE 0.9 % IV BOLUS (SEPSIS)
1000.0000 mL | INTRAVENOUS | Status: AC
  Administered 2014-10-07: 1000 mL via INTRAVENOUS

## 2014-10-07 MED ORDER — DIVALPROEX SODIUM 125 MG PO CSDR
250.0000 mg | DELAYED_RELEASE_CAPSULE | Freq: Two times a day (BID) | ORAL | Status: DC
  Filled 2014-10-07: qty 2

## 2014-10-07 MED ORDER — SODIUM CHLORIDE 0.9 % IJ SOLN: 3.0000 mL | Freq: Two times a day (BID) | INTRAMUSCULAR | Status: DC

## 2014-10-07 MED ORDER — DIVALPROEX SODIUM 125 MG PO DR TAB
125.0000 mg | DELAYED_RELEASE_TABLET | Freq: Two times a day (BID) | ORAL | Status: DC
  Filled 2014-10-07 (×2): qty 1

## 2014-10-07 MED ORDER — LORAZEPAM 2 MG/ML IJ SOLN: 2.0000 mg | INTRAMUSCULAR | Status: DC | PRN

## 2014-10-07 MED ORDER — HEPARIN SODIUM (PORCINE) 5000 UNIT/ML IJ SOLN: 5000.0000 [IU] | Freq: Three times a day (TID) | INTRAMUSCULAR | Status: DC

## 2014-10-07 MED ORDER — ACETAMINOPHEN 650 MG RE SUPP
650.0000 mg | Freq: Four times a day (QID) | RECTAL | Status: DC | PRN
Start: 2014-10-07 — End: 2014-10-08

## 2014-10-07 MED ORDER — SENNA 8.6 MG PO TABS: 1.0000 | ORAL_TABLET | Freq: Every day | ORAL | Status: DC

## 2014-10-07 NOTE — Care Management Important Message (Signed)
Important Message  Patient Details  Name: Katrina Gonzalez MRN: [id number] Date of Birth: 1920-08-31   Medicare Important Message Given:  Yes-second notification given    Jolee Ewing, RN 10/07/2014, 3:59 PM

## 2014-10-07 NOTE — ED Provider Notes (Signed)
[facility] Emergency Department Provider Note  ____________________________________________  Time seen: Approximately 1:42 AM  I have reviewed the triage vital signs and the nursing notes.   HISTORY  Chief Complaint Seizures  The patient has chronic dementia and is acutely altered from her baseline  HPI Katrina Gonzalez is a 81 y.o. female with history of dementia, hypertension, coronary artery disease, stroke, and possible seizures who presents by EMS for acute altered mental status.  Her daughters, who arrived shortly after she did, state that they found her minimally responsive at home.  No one witnessed any seizure like activity but her presentation was concerning to them for being similar to prior seizures.  They gave her some Valium and called EMS.  Upon arrival to the emergency department she localizes to pain and opens her eyes and blinks and looks around but is nonverbal and minimally responsive.  She also got some Narcan by EMS but it had no reaction.  The family states that over the last couple of days the patient has been somewhat tremulous which, they say, is indicative of an impending seizure or altered mental status.  She was previously DO NOT RESUSCITATE/DO NOT INTUBATE during his severe illness about one year ago but currently they do not have orders in place.  They state that she has not complained of any chest pain, shortness of breath, abdominal pain, or headache.   Past Medical History  Diagnosis Date  . Coronary artery disease   . Thyroid disease   . Stroke (HCC)   . Seizures (HCC)   . Hypertension     Patient Active Problem List   Diagnosis Date Noted  . Acute encephalopathy 10/07/2014  . Seizure (HCC) 10/07/2014  . Sepsis secondary to UTI (HCC) 10/07/2014  . Dementia 10/07/2014    Past Surgical History  Procedure Laterality Date  . Abdominal hysterectomy    . Fracture surgery      No current outpatient prescriptions on  file.  Allergies Review of patient's allergies indicates no known allergies.  Family History  Problem Relation Age of Onset  . Heart disease Father   . Hypertension Father     Social History Social History  Substance Use Topics  . Smoking status: Never Smoker   . Smokeless tobacco: None  . Alcohol Use: No    Review of Systems Unable to obtain from the patient due to altered mental status/minimal responsiveness  ____________________________________________   PHYSICAL EXAM:  VITAL SIGNS: ED Triage Vitals  Enc Vitals Group     BP 10/07/14 0123 114/65 mmHg     Pulse Rate 10/07/14 0123 84     Resp 10/07/14 0123 18     Temp 10/07/14 0128 96.9 F (36.1 C)     Temp Source 10/07/14 0128 Axillary     SpO2 10/07/14 0123 98 %     Weight 10/07/14 0123 130 lb (58.968 kg)     Height 10/07/14 0123  (1.651 m)     Head Cir --      Peak Flow --      Pain Score --      Pain Loc --      Pain Edu? --      Excl. in GC? --     Constitutional: Elderly female, minimally responsive.  Opens eyes and looks around occasionally.  Localizes to pain.  Protecting airway, breathing comfortably, normal capillary refill with good peripheral perfusion. Eyes: Conjunctivae are normal. PERRL. EOMI. Head: Atraumatic. Nose: No congestion/rhinnorhea.  Mouth/Throat: Mucous membranes are tacky.  Oropharynx non-erythematous. Neck: No stridor.   Cardiovascular: Normal rate, regular rhythm. Grossly normal heart sounds.  Good peripheral circulation. Respiratory: Normal respiratory effort.  No retractions. Lungs CTAB. Gastrointestinal: Soft and nontender. No distention. No abdominal bruits. No CVA tenderness. Musculoskeletal: No lower extremity tenderness nor edema.  No joint effusions. Neurologic: No gross focal neurologic deficits are appreciated.  GCS 10 Skin:  Skin is warm, dry and intact. No rash noted.   ____________________________________________   LABS (all labs ordered are listed, but only  abnormal results are displayed)  Labs Reviewed  CBC WITH DIFFERENTIAL/PLATELET - Abnormal; Notable for the following:    WBC 13.3 (*)    MCV 100.8 (*)    MCH 34.2 (*)    Platelets 129 (*)    Neutro Abs 7.7 (*)    Lymphs Abs 4.6 (*)    All other components within normal limits  URINALYSIS COMPLETEWITH MICROSCOPIC ([facility] ONLY) - Abnormal; Notable for the following:    Color, Urine AMBER (*)    APPearance CLOUDY (*)    Protein, ur 30 (*)    Leukocytes, UA 3+ (*)    Bacteria, UA FEW (*)    All other components within normal limits  LACTIC ACID, PLASMA - Abnormal; Notable for the following:    Lactic Acid, Venous 3.9 (*)    All other components within normal limits  COMPREHENSIVE METABOLIC PANEL - Abnormal; Notable for the following:    Sodium 130 (*)    Chloride 98 (*)    Glucose, Bld 130 (*)    Calcium 8.5 (*)    Total Protein 6.3 (*)    Albumin 3.4 (*)    All other components within normal limits  C DIFFICILE QUICK SCREEN W PCR REFLEX  CULTURE, BLOOD (ROUTINE X 2)  CULTURE, BLOOD (ROUTINE X 2)  URINE CULTURE  TROPONIN I  LACTIC ACID, PLASMA   ____________________________________________  EKG  ED ECG REPORT I, Toyoko Silos, the attending physician, personally viewed and interpreted this ECG.  Date: 10/07/2014 EKG Time: 01:22 Rate: 83 Rhythm: normal sinus rhythm QRS Axis: normal Intervals: normal ST/T Wave abnormalities: normal Conduction Disutrbances: none Narrative Interpretation: unremarkable  ____________________________________________  RADIOLOGY   Ct Head Wo Contrast  10/07/2014   CLINICAL DATA:  Witnessed seizure.  EXAM: CT HEAD WITHOUT CONTRAST  TECHNIQUE: Contiguous axial images were obtained from the base of the skull through the vertex without intravenous contrast.  COMPARISON:  01/30/2013  FINDINGS: There is no intracranial hemorrhage or extra-axial fluid collection. There is severe generalized atrophy. There is severe hemispheric white matter  hypodensity which may represent chronic small vessel ischemic disease. There is more focally prominent hypodensity in the posterior left occipital lobe which is unchanged and this may represent remote infarction. No acute findings are evident. No bony abnormalities evident. Visible paranasal sinuses are clear.  IMPRESSION: Severe atrophy and chronic white matter disease. Probable remote left posterior occipital infarction. No acute findings.   Electronically Signed   By: Ellery Plunk M.D.   On: 10/07/2014 02:41   Dg Chest Portable 1 View  10/07/2014   CLINICAL DATA:  Acute onset of seizure and decreased level of consciousness. Initial encounter.  EXAM: PORTABLE CHEST 1 VIEW  COMPARISON:  Chest radiograph performed 01/30/2013  FINDINGS: The lungs are well-aerated. Mild bibasilar atelectasis is noted. There is no evidence of pleural effusion or pneumothorax.  The cardiomediastinal silhouette is borderline normal in size. Degenerative change is noted at both glenohumeral joints, with  sclerosis and prominent osteophytes.  IMPRESSION: Mild bibasilar atelectasis noted.  Lungs otherwise clear.   Electronically Signed   By: Roanna Raider M.D.   On: 10/07/2014 02:57    ____________________________________________   PROCEDURES  Procedure(s) performed: None  Critical Care performed: No ____________________________________________   INITIAL IMPRESSION / ASSESSMENT AND PLAN / ED COURSE  Pertinent labs & imaging results that were available during my care of the patient were reviewed by me and considered in my medical decision making (see chart for details).  The patient arrived by EMS essentially unresponsive although she does localize to pain and occasionally opens her eyes and looks around.  Her daughters arrived shortly and confirm that she has advanced dementia.  Additionally, she received some Valium at home because the family was concerned that she had had a seizure.  I see no specific evidence  of seizure, though this is certainly possible.  I will obtain a head CT and pursue a broad laboratory evaluation to look for infectious sources that would cause delirium.  She is currently protecting her airway.  Her daughters confirmed that she has been DO NOT RESUSCITATE/DO NOT INTUBATE in the past although they have no specific orders in place at this time.  Though she is minimally responsive she is in no acute distress and, as I said, protecting her airway, breathing well, and has good peripheral circulation and normal blood pressure.  Though a urinary tract infection is very probable, she has no tachycardia, normal blood pressure and is afebrile.  We will obtain a catheter urine specimen as well as a chest x-ray to look for sources of infection.  She has no evidence of meningitis.  Because of her age and frail appearance, I will start a small fluid bolus but will not be overly aggressive with fluid resuscitation given my concern that she will develop pulmonary edema.  Feel it is most important at this time that she go for a stat head CT due to the possibility/probability of intracranial pathology.  ----------------------------------------- 6:17 AM on 10/07/2014 -----------------------------------------  (Note that documentation was delayed due to multiple ED patients requiring immediate care.)  It proved to be very difficult to obtain labs on this patient.  Although we had IV access, the initial set of blood hemolyzed for her metabolic panel.  Additionally they could not obtain a lactic acid on her.  After several hours and several attempts, the lab came down and obtained blood work.  As a result, we discovered well after the patient had been admitted that she had a significantly elevated lactate at greater than 3.  She had received 500 mL of normal saline and in spite of her lactate and grossly infected urine and her blood pressure and heart rate have been normal.  She has also been afebrile with her  last temperature about 97.  I did not identify her as meeting sepsis criteria until the rest of her labs came back including the lactate.  I was also unaware that at a repeat of her temperature she was down to 95.23F, but I do not know if this temperature was measured axillary, oral, or rectal.  There was a delay in diagnosis of the patient's sepsis due to an atypical presentation and vascular access/blood draw delays.  After her lactate was brought to my attention, I ordered another fluid bolus and the hospitalist was notified.  I also ordered another gram of Rocephin for a total of 2 g IV.  He remains normotensive.  ____________________________________________  FINAL CLINICAL IMPRESSION(S) / ED DIAGNOSES  Final diagnoses:  UTI (lower urinary tract infection)  Altered mental status, unspecified altered mental status type  Delirium  sepsis    NEW MEDICATIONS STARTED DURING THIS VISIT:  New Prescriptions   No medications on file     Loleta Rose, MD 10/07/14 [phone]

## 2014-10-07 NOTE — H&P (Signed)
[facility] - [place] at [facility]   PATIENT NAME: Katrina Gonzalez    MR#:  [id number]  DATE OF BIRTH:  1920/01/16  DATE OF ADMISSION:  10/07/2014  PRIMARY CARE PHYSICIAN: [facility]   REQUESTING/REFERRING PHYSICIAN: York Cerise  CHIEF COMPLAINT:   Chief Complaint  Patient presents with  . Seizures   altered sensorium  HISTORY OF PRESENT ILLNESS:  Katrina Gonzalez  is a 81 y.o. female with a known history of advanced dementia, bedbound on total care dependent, hypertension, coronary artery disease, thyroid disease, prior CVA was brought in by EMS with the complaints of altered sensorium following a seizure episode witnessed by her daughter. According to the patient daughter who is with her at this time, she was noted to have eyes rolled up with staring look and mouth full of froth and unresponsive, hence called EMS who found the patient unresponsive, brought to the emergency room for further evaluation. Per patient's daughter, she has a history of seizure disorder and has been having some twitching movements for the past few days. Because of her advanced dementia as she was under care of hospice for some time and discharged from hospice few months ago to home and currently been taking care by her daughter at home. No history of any fever, chills, shortness of breath, chest pain, nausea, vomiting, diarrhea. Evaluation in the ED revealed stable vital signs and labs revealed elevated WBC of 13.3, abnormal urinalysis significant for UTI. Chest x-ray negative for acute cardio or pulmonary pathology. CT head severe atrophic changes and old infarction otherwise negative for acute findings. EKG normal sinus rhythm with ventricular rate of 83 bpm, left anterior fascicular block. CMP is pending at this time. After obtaining blood and urine cultures patient was started on IV ceftriaxone. Patient's daughters mention that she was on DO NOT RESUSCITATE status while  she was under care of hospice which was revoked when she was discharged home and currently they desire to reinstate DO NOT RESUSCITATE order. Hence DO NOT RESUSCITATE order was placed in patient's chart.  PAST MEDICAL HISTORY:   Past Medical History  Diagnosis Date  . Coronary artery disease   . Thyroid disease   . Stroke (HCC)   . Seizures (HCC)   . Hypertension     PAST SURGICAL HISTORY:   Past Surgical History  Procedure Laterality Date  . Abdominal hysterectomy    . Fracture surgery      SOCIAL HISTORY:   Social History  Substance Use Topics  . Smoking status: Never Smoker   . Smokeless tobacco: Not on file  . Alcohol Use: No    FAMILY HISTORY:   Family History  Problem Relation Age of Onset  . Heart disease Father   . Hypertension Father     DRUG ALLERGIES:  No Known Allergies  REVIEW OF SYSTEMS:   Review of Systems  Unable to perform ROS: mental acuity    MEDICATIONS AT HOME:   Prior to Admission medications   Not on File      VITAL SIGNS:  Blood pressure 126/67, pulse 67, temperature 97.4 F (36.3 C), temperature source Oral, resp. rate 15, height  (1.651 m), weight 58.968 kg (130 lb), SpO2 96 %.  PHYSICAL EXAMINATION:  Physical Exam  Constitutional: She appears well-developed and well-nourished. No distress.  HENT:  Head: Normocephalic and atraumatic.  Right Ear: External ear normal.  Left Ear: External ear normal.  Nose: Nose normal.  Mouth/Throat: Oropharynx is clear  and moist. No oropharyngeal exudate.  Eyes: EOM are normal. Pupils are equal, round, and reactive to light. No scleral icterus.  Neck: Normal range of motion. Neck supple. No JVD present. No thyromegaly present.  Cardiovascular: Normal rate, regular rhythm, normal heart sounds and intact distal pulses.  Exam reveals no friction rub.   No murmur heard. Respiratory: Effort normal and breath sounds normal. No respiratory distress. She has no wheezes. She has no rales. She  exhibits no tenderness.  GI: Soft. Bowel sounds are normal. She exhibits no distension and no mass. There is no tenderness. There is no rebound and no guarding.  Musculoskeletal: She exhibits no edema.  Lymphadenopathy:    She has no cervical adenopathy.  Neurological: No cranial nerve deficit.  Altered sensorium, responsive to only painful stimuli with withdrawing limbs Limited mobility of both lower extremities because of prior CVA  Skin: Skin is warm. No rash noted. No erythema.  Psychiatric:  Unable to assess   LABORATORY PANEL:   CBC  Recent Labs Lab 10/07/14 0131  WBC 13.3*  HGB 13.0  HCT 38.4  PLT 129*   ------------------------------------------------------------------------------------------------------------------  Chemistries  No results for input(s): NA, K, CL, CO2, GLUCOSE, BUN, CREATININE, CALCIUM, MG, AST, ALT, ALKPHOS, BILITOT in the last 168 hours.  Invalid input(s): GFRCGP ------------------------------------------------------------------------------------------------------------------  Cardiac Enzymes  Recent Labs Lab 10/07/14 0131  TROPONINI <0.03   ------------------------------------------------------------------------------------------------------------------  RADIOLOGY:  Ct Head Wo Contrast  10/07/2014   CLINICAL DATA:  Witnessed seizure.  EXAM: CT HEAD WITHOUT CONTRAST  TECHNIQUE: Contiguous axial images were obtained from the base of the skull through the vertex without intravenous contrast.  COMPARISON:  01/30/2013  FINDINGS: There is no intracranial hemorrhage or extra-axial fluid collection. There is severe generalized atrophy. There is severe hemispheric white matter hypodensity which may represent chronic small vessel ischemic disease. There is more focally prominent hypodensity in the posterior left occipital lobe which is unchanged and this may represent remote infarction. No acute findings are evident. No bony abnormalities evident. Visible  paranasal sinuses are clear.  IMPRESSION: Severe atrophy and chronic white matter disease. Probable remote left posterior occipital infarction. No acute findings.   Electronically Signed   By: Ellery Plunk M.D.   On: 10/07/2014 02:41   Dg Chest Portable 1 View  10/07/2014   CLINICAL DATA:  Acute onset of seizure and decreased level of consciousness. Initial encounter.  EXAM: PORTABLE CHEST 1 VIEW  COMPARISON:  Chest radiograph performed 01/30/2013  FINDINGS: The lungs are well-aerated. Mild bibasilar atelectasis is noted. There is no evidence of pleural effusion or pneumothorax.  The cardiomediastinal silhouette is borderline normal in size. Degenerative change is noted at both glenohumeral joints, with sclerosis and prominent osteophytes.  IMPRESSION: Mild bibasilar atelectasis noted.  Lungs otherwise clear.   Electronically Signed   By: Roanna Raider M.D.   On: 10/07/2014 02:57    EKG:   Orders placed or performed during the hospital encounter of 10/07/14  . EKG 12-Lead  . EKG 12-Lead  Normal sinus rhythm with ventricular rate of 83 bpm, left anterior fascicular block  IMPRESSION AND PLAN:   1. Acute encephalopathy secondary to combination of factors-advanced dementia, postictal phase following seizure episode and sepsis secondary to UTI. 2. Seizure episode, witnessed by her daughter. History of prior seizure disorder. 3. Sepsis secondary to UTI. 4. Advanced dementia, bedbound on total care dependent. Patient was under care of hospice in the past and discharged home a few months ago. 5. Hypertension, stable.  Plan:  Admit, neuro watch, seizure precautions, IV fluids, continue IV antibiotics-ceftriaxone, when necessary Ativan for seizures, follow-up CBC and CMP. Neurology consultation requested for further advice.  DVT prophylaxis-subcutaneous heparin. Condition-critical. Discussed with patient's daughters. Patient's daughters desires to place her on DO NOT RESUSCITATE  status.    All the records are reviewed and case discussed with ED provider. Management plans discussed with the  family and they are in agreement.  CODE STATUS: DO NOT RESUSCITATE/ DO NOT INTUBATE  TOTAL TIME TAKING CARE OF THIS PATIENT: 50 minutes.    Jonnie Kind N M.D on 10/07/2014 at 5:44 AM  Between 7am to 6pm - Pager - [phone]  After 6pm go to [email or web address] - password [email or web address] [facility]  [facility]  Office  [phone]  CC: Primary care physician; [facility]

## 2014-10-07 NOTE — Progress Notes (Signed)
ANTIBIOTIC CONSULT NOTE - INITIAL  Pharmacy Consult for Ceftriaxone Indication: Sepsis due to UTI  No Known Allergies  Patient Measurements: Height:  (165.1 cm) Weight: 136 lb 11.2 oz (62.007 kg) IBW/kg (Calculated) : 57   Vital Signs: Temp: 96.7 F (35.9 C) (10/03 0702) Temp Source: Axillary (10/03 0702) BP: 123/60 mmHg (10/03 0702) Pulse Rate: 71 (10/03 0702) Intake/Output from previous day: 10/02 0701 - 10/03 0700 In: -  Out: 15 [Urine:15] Intake/Output from this shift:    Labs:  Recent Labs  10/07/14 0131 10/07/14 0452  WBC 13.3*  --   HGB 13.0  --   PLT 129*  --   CREATININE  --  0.73   Estimated Creatinine Clearance: 40.4 mL/min (by C-G formula based on Cr of 0.73). No results for input(s): VANCOTROUGH, VANCOPEAK, VANCORANDOM, GENTTROUGH, GENTPEAK, GENTRANDOM, TOBRATROUGH, TOBRAPEAK, TOBRARND, AMIKACINPEAK, AMIKACINTROU, AMIKACIN in the last 72 hours.   Microbiology: Recent Results (from the past 720 hour(s))  C difficile quick scan w PCR reflex     Status: None   Collection Time: 10/07/14  3:57 AM  Result Value Ref Range Status   C Diff antigen NEGATIVE NEGATIVE Final   C Diff toxin NEGATIVE NEGATIVE Final   C Diff interpretation Negative for C. difficile  Final    Medical History: Past Medical History  Diagnosis Date  . Coronary artery disease   . Thyroid disease   . Stroke (HCC)   . Seizures (HCC)   . Hypertension     Medications:  Scheduled:  . aspirin EC  81 mg Oral Daily  . [START ON 10/08/2014] cefTRIAXone (ROCEPHIN)  IV  2 g Intravenous Q24H  . heparin  5,000 Units Subcutaneous 3 times per day  . sodium chloride  3 mL Intravenous Q12H   Assessment: Pharmacy consulted to dose Ceftriaxone in a 81 yo female for empiric treatment of sepsis due to UTI.  BCx: pending, UCx: pending  Goal of Therapy:  Resolution of infection  Plan:  Patient has received Ceftriaxone 1 gm IV x 2 in ED.  Will transition patient to Ceftriaxone 2 gm IV  q24h to start on 10/4.  If BCx with no growth in 48 hours may de-escalate therapy to Ceftriaxone 1 gm IV q24h.  Follow up culture results  Pharmacy will continue to follow.  Bailen Geffre G 10/07/2014,8:03 AM

## 2014-10-07 NOTE — ED Notes (Signed)
Pt to ED from home via EMS due to seizure witnessed by daughter and decreased level of loc. Per EMS pt with hx of seizures, family gave  of valium. EMS gave .5 mg of narcan in route with no response. On arrival pt is postictal, responsive to painful stimuli. Vitals wnl at this time.

## 2014-10-07 NOTE — Progress Notes (Addendum)
Lactic acid 4.5. Called to DR PATEL. . MD INCREASED IVF TO 125/HR. PT RECEIVING ROCEPHIN IV. Pt with maroon colored stools. Sample to lab for occult blood

## 2014-10-07 NOTE — ED Notes (Signed)
Pt with family at bedside.

## 2014-10-07 NOTE — Progress Notes (Addendum)
Stool occult blood positive. Heparin discontinued and scds and teds applied. Family does not want gi consult. Has had episode of bleeding before and was not treated at that time. pty takes quinipril at home. Dr patel report will give lisinopril while in the hospital

## 2014-10-07 NOTE — Care Management Note (Signed)
Case Management Note  Patient Details  Name: [name] MRN: [id number] Date of Birth: [birth year]  Subjective/Objective:  81yo [name] was admitted 10/07/14 after having seizure activity and decreased LOC at home. Also diagnosed with a UTI in the ED. Hx. of advanced dementia, HTN, CVA. Her daughter Luna Kitchens ph: [phone] resides with her and provides total care. Other family members assist with care as needed. Pharmacy is [facility]. PCP= Amy Allison Quarry, RN [phone] home visit nurse for [facility] per Ms Vassel is bedbound. Ms Hettinger has a hospital bed at home provided by [facility]. No oxygen at home, no other home equipment. No current home health services. [facility] following [name] at home in the past but discharged her. Case Management will follow for discharge planning.                    Action/Plan:   Expected Discharge Date:  10/10/14               Expected Discharge Plan:     In-House Referral:     Discharge planning Services     Post Acute Care Choice:    Choice offered to:     DME Arranged:    DME Agency:     HH Arranged:    HH Agency:     Status of Service:     Medicare Important Message Given:  Yes-second notification given Date Medicare IM Given:    Medicare IM give by:    Date Additional Medicare IM Given:    Additional Medicare Important Message give by:     If discussed at Long Length of Stay Meetings, dates discussed:    Additional Comments:  Jeniah Kishi A, RN 10/07/2014, 11:09 AM

## 2014-10-07 NOTE — Progress Notes (Signed)
No seizure activity. Started to verbalize more this evening. Ate lunch and supper. Fed by dtr. Took meds crushed in applesauce. Stools remain with occasional blood

## 2014-10-07 NOTE — Consult Note (Signed)
Reason for Consult: seizure Referring Physician: Dr. Trula Slade Katrina Gonzalez is an 81 y.o. female.  HPI: seen at request of Dr. Posey Pronto for seizure;  81 yo RHD F with hx of seizures presents to [facility] after being more confused and having a witnessed seizures.  Per family, last seizure was in March 2016.  Daughter is at bedside and feels like she is close to baseline in which she does not normally talk or walk.  Past Medical History  Diagnosis Date  . Coronary artery disease   . Thyroid disease   . Stroke ([place])   . Seizures ([place])   . Hypertension     Past Surgical History  Procedure Laterality Date  . Abdominal hysterectomy    . Fracture surgery      Family History  Problem Relation Age of Onset  . Heart disease Father   . Hypertension Father     Social History:  reports that she has never smoked. She does not have any smokeless tobacco history on file. She reports that she does not drink alcohol. Her drug history is not on file.  Allergies: No Known Allergies  Medications:  Personally reviewed by me as per chart  Results for orders placed or performed during the hospital encounter of 10/07/14 (from the past 48 hour(s))  Troponin I     Status: None   Collection Time: 10/07/14  1:31 AM  Result Value Ref Range   Troponin I <0.03 <0.031 ng/mL  CBC with Differential/Platelet     Status: Abnormal   Collection Time: 10/07/14  1:31 AM  Result Value Ref Range   WBC 13.3 (H) 3.6 - 11.0 K/uL   RBC 3.81 3.80 - 5.20 MIL/uL   Hemoglobin 13.0 12.0 - 16.0 g/dL   HCT 38.4 35.0 - 47.0 %   MCV 100.8 (H) 80.0 - 100.0 fL   MCH 34.2 (H) 26.0 - 34.0 pg   MCHC 33.9 32.0 - 36.0 g/dL   RDW 12.9 11.5 - 14.5 %   Platelets 129 (L) 150 - 440 K/uL   Neutrophils Relative % 57 %   Neutro Abs 7.7 (H) 1.4 - 6.5 K/uL   Lymphocytes Relative 35 %   Lymphs Abs 4.6 (H) 1.0 - 3.6 K/uL   Monocytes Relative 6 %   Monocytes Absolute 0.7 0.2 - 0.9 K/uL   Eosinophils Relative 1 %   Eosinophils Absolute 0.1 0  - 0.7 K/uL   Basophils Relative 1 %   Basophils Absolute 0.1 0 - 0.1 K/uL  Urinalysis complete, with microscopic ([facility] only)     Status: Abnormal   Collection Time: 10/07/14  2:34 AM  Result Value Ref Range   Color, Urine AMBER (A) YELLOW   APPearance CLOUDY (A) CLEAR   Glucose, UA NEGATIVE NEGATIVE mg/dL   Bilirubin Urine NEGATIVE NEGATIVE   Ketones, ur NEGATIVE NEGATIVE mg/dL   Specific Gravity, Urine 1.012 1.005 - 1.030   Hgb urine dipstick NEGATIVE NEGATIVE   pH 6.0 5.0 - 8.0   Protein, ur 30 (A) NEGATIVE mg/dL   Nitrite NEGATIVE NEGATIVE   Leukocytes, UA 3+ (A) NEGATIVE   RBC / HPF 0-5 0 - 5 RBC/hpf   WBC, UA TOO NUMEROUS TO COUNT 0 - 5 WBC/hpf   Bacteria, UA FEW (A) NONE SEEN   Squamous Epithelial / LPF NONE SEEN NONE SEEN   WBC Clumps PRESENT   C difficile quick scan w PCR reflex     Status: None   Collection Time: 10/07/14  3:47 AM  Result Value Ref Range   C Diff antigen NEGATIVE NEGATIVE   C Diff toxin NEGATIVE NEGATIVE   C Diff interpretation Negative for C. difficile   Blood culture (routine x 2)     Status: None (Preliminary result)   Collection Time: 10/07/14  4:38 AM  Result Value Ref Range   Specimen Description BLOOD LEFT FACE    Special Requests BOTTLES DRAWN AEROBIC AND ANAEROBIC 3CC    Culture NO GROWTH < 12 HOURS    Report Status PENDING   Lactic acid, plasma     Status: Abnormal   Collection Time: 10/07/14  4:52 AM  Result Value Ref Range   Lactic Acid, Venous 3.9 (HH) 0.5 - 2.0 mmol/L    Comment: CRITICAL RESULT CALLED TO, READ BACK BY AND VERIFIED WITH RACHEL HAYDEN AT [place] ON 10/07/14.Marland KitchenMarland KitchenClarke County Endoscopy Center Dba [facility]   Comprehensive metabolic panel     Status: Abnormal   Collection Time: 10/07/14  4:52 AM  Result Value Ref Range   Sodium 130 (L) 135 - 145 mmol/L   Potassium 4.0 3.5 - 5.1 mmol/L   Chloride 98 (L) 101 - 111 mmol/L   CO2 25 22 - 32 mmol/L   Glucose, Bld 130 (H) 65 - 99 mg/dL   BUN 16 6 - 20 mg/dL   Creatinine, Ser 0.73 0.44 - 1.00 mg/dL   Calcium 8.5 (L)  8.9 - 10.3 mg/dL   Total Protein 6.3 (L) 6.5 - 8.1 g/dL   Albumin 3.4 (L) 3.5 - 5.0 g/dL   AST 37 15 - 41 U/L   ALT 23 14 - 54 U/L   Alkaline Phosphatase 52 38 - 126 U/L   Total Bilirubin 0.9 0.3 - 1.2 mg/dL   GFR calc non Af Amer >60 >60 mL/min   GFR calc Af Amer >60 >60 mL/min    Comment: (NOTE) The eGFR has been calculated using the CKD EPI equation. This calculation has not been validated in all clinical situations. eGFR's persistently <60 mL/min signify possible Chronic Kidney Disease.    Anion gap 7 5 - 15  Blood culture (routine x 2)     Status: None (Preliminary result)   Collection Time: 10/07/14  4:52 AM  Result Value Ref Range   Specimen Description BLOOD    Special Requests BOTTLES DRAWN AEROBIC AND ANAEROBIC 3CC    Culture NO GROWTH < 12 HOURS    Report Status PENDING   Lactic acid, plasma     Status: Abnormal   Collection Time: 10/07/14  7:58 AM  Result Value Ref Range   Lactic Acid, Venous 4.5 (HH) 0.5 - 2.0 mmol/L    Comment: CRITICAL RESULT CALLED TO, READ BACK BY AND VERIFIED WITH DAVA ISLEY AT 1583 ON 10/07/14.Marland KitchenMarland KitchenParker   Occult blood card to lab, stool     Status: Abnormal   Collection Time: 10/07/14  8:25 AM  Result Value Ref Range   Fecal Occult Bld POSITIVE (A) NEGATIVE    Ct Head Wo Contrast  10/07/2014   CLINICAL DATA:  Witnessed seizure.  EXAM: CT HEAD WITHOUT CONTRAST  TECHNIQUE: Contiguous axial images were obtained from the base of the skull through the vertex without intravenous contrast.  COMPARISON:  01/30/2013  FINDINGS: There is no intracranial hemorrhage or extra-axial fluid collection. There is severe generalized atrophy. There is severe hemispheric white matter hypodensity which may represent chronic small vessel ischemic disease. There is more focally prominent hypodensity in the posterior left occipital lobe which is unchanged and this may represent remote  infarction. No acute findings are evident. No bony abnormalities evident. Visible paranasal  sinuses are clear.  IMPRESSION: Severe atrophy and chronic white matter disease. Probable remote left posterior occipital infarction. No acute findings.   Electronically Signed   By: Andreas Newport M.D.   On: 10/07/2014 02:41   Dg Chest Portable 1 View  10/07/2014   CLINICAL DATA:  Acute onset of seizure and decreased level of consciousness. Initial encounter.  EXAM: PORTABLE CHEST 1 VIEW  COMPARISON:  Chest radiograph performed 01/30/2013  FINDINGS: The lungs are well-aerated. Mild bibasilar atelectasis is noted. There is no evidence of pleural effusion or pneumothorax.  The cardiomediastinal silhouette is borderline normal in size. Degenerative change is noted at both glenohumeral joints, with sclerosis and prominent osteophytes.  IMPRESSION: Mild bibasilar atelectasis noted.  Lungs otherwise clear.   Electronically Signed   By: Garald Balding M.D.   On: 10/07/2014 02:57    Review of Systems  Unable to perform ROS: dementia   Blood pressure 182/96, pulse 73, temperature 98.3 F (36.8 C), temperature source Axillary, resp. rate 16, height _0  (1.651 m), weight 62.007 kg (136 lb 11.2 oz), SpO2 100 %. Physical Exam  Nursing note and vitals reviewed. Constitutional: She appears well-developed and well-nourished. No distress.  HENT:  Head: Normocephalic and atraumatic.  Right Ear: External ear normal.  Left Ear: External ear normal.  Nose: Nose normal.  Mouth/Throat: Oropharynx is clear and moist.  Eyes: Conjunctivae and EOM are normal. Pupils are equal, round, and reactive to light.  Neck: Normal range of motion. Neck supple.  Cardiovascular: Normal rate, regular rhythm, normal heart sounds and intact distal pulses.   Respiratory: Effort normal and breath sounds normal.  GI: Soft. Bowel sounds are normal.  Musculoskeletal: Normal range of motion.  Neurological:  Eyes open but does not track or follow, localizes PERRLA, EOMI, face symmetric Moves B equally, increased tone 1+/4 B, mute  plantars Withdrawals to pain B  Skin: She is not diaphoretic.   CT of head personally reviewed by me with severe atrophy and white matter changes, nothing acute  Assessment/Plan: 1.  Epilepsy-  Not quite controlled but current episode sounds like it was provoked by UTI 2.  Severe dementia-  Close to baseline per daughter -  Treat UTI -  Increased Depakote to 166m TID -  PRN ativan ok -  Will sign off, please call with questions -  F/u in 3 months with Dr. PMelrose Nakayamaat KThe [facility] MMansura10/03/2014, 2:20 PM

## 2014-10-07 NOTE — Progress Notes (Addendum)
[facility] - [place] at [facility]   PATIENT NAME: Naiara Lombardozzi    MR#:  [id number]  DATE OF BIRTH:  1920/02/19  SUBJECTIVE:  Sleepy dter at bedside. Pt doen ot talk much  REVIEW OF SYSTEMS:   ROS Tolerating Diet:yes Tolerating PT: no bed bound  DRUG ALLERGIES:  No Known Allergies  VITALS:  Blood pressure 132/71, pulse 82, temperature 98.8 F (37.1 C), temperature source Oral, resp. rate 18, height  (1.651 m), weight 62.007 kg (136 lb 11.2 oz), SpO2 99 %.  PHYSICAL EXAMINATION:   Physical Exam  GENERAL:  81 y.o.-year-old patient lying in the bed with no acute distress.  EYES: Pupils equal, round, reactive to light and accommodation. No scleral icterus. Extraocular muscles intact.  HEENT: Head atraumatic, normocephalic. Oropharynx and nasopharynx clear.  NECK:  Supple, no jugular venous distention. No thyroid enlargement, no tenderness.  LUNGS: Normal breath sounds bilaterally, no wheezing, rales, rhonchi. No use of accessory muscles of respiration.  CARDIOVASCULAR: S1, S2 normal. No murmurs, rubs, or gallops.  ABDOMEN: Soft, nontender, nondistended. Bowel sounds present. No organomegaly or mass.  EXTREMITIES: No cyanosis, clubbing or edema b/l.    NEUROLOGIC: unable to assess. Grossly nonfocal PSYCHIATRIC:sleepy SKIN: No obvious rash, lesion, or ulcer.    LABORATORY PANEL:   CBC  Recent Labs Lab 10/07/14 0131  WBC 13.3*  HGB 13.0  HCT 38.4  PLT 129*    Chemistries   Recent Labs Lab 10/07/14 0452  NA 130*  K 4.0  CL 98*  CO2 25  GLUCOSE 130*  BUN 16  CREATININE 0.73  CALCIUM 8.5*  AST 37  ALT 23  ALKPHOS 52  BILITOT 0.9    Cardiac Enzymes  Recent Labs Lab 10/07/14 0131  TROPONINI <0.03    RADIOLOGY:  Ct Head Wo Contrast  10/07/2014   CLINICAL DATA:  Witnessed seizure.  EXAM: CT HEAD WITHOUT CONTRAST  TECHNIQUE: Contiguous axial images were obtained from the base of the skull through the vertex without  intravenous contrast.  COMPARISON:  01/30/2013  FINDINGS: There is no intracranial hemorrhage or extra-axial fluid collection. There is severe generalized atrophy. There is severe hemispheric white matter hypodensity which may represent chronic small vessel ischemic disease. There is more focally prominent hypodensity in the posterior left occipital lobe which is unchanged and this may represent remote infarction. No acute findings are evident. No bony abnormalities evident. Visible paranasal sinuses are clear.  IMPRESSION: Severe atrophy and chronic white matter disease. Probable remote left posterior occipital infarction. No acute findings.   Electronically Signed   By: Ellery Plunk M.D.   On: 10/07/2014 02:41   Dg Chest Portable 1 View  10/07/2014   CLINICAL DATA:  Acute onset of seizure and decreased level of consciousness. Initial encounter.  EXAM: PORTABLE CHEST 1 VIEW  COMPARISON:  Chest radiograph performed 01/30/2013  FINDINGS: The lungs are well-aerated. Mild bibasilar atelectasis is noted. There is no evidence of pleural effusion or pneumothorax.  The cardiomediastinal silhouette is borderline normal in size. Degenerative change is noted at both glenohumeral joints, with sclerosis and prominent osteophytes.  IMPRESSION: Mild bibasilar atelectasis noted.  Lungs otherwise clear.   Electronically Signed   By: Roanna Raider M.D.   On: 10/07/2014 02:57     ASSESSMENT AND PLAN:  81 y.o. female with a known history of advanced dementia, bedbound on total care dependent, hypertension, coronary artery disease, thyroid disease, prior CVA was brought in by EMS with the complaints of altered sensorium  following a seizure episode witnessed by her daughter. According to the patient daughter who is with her at this time, she was noted to have eyes rolled up with staring look and mouth full of froth and unresponsive, hence   1. Acute encephalopathy secondary to combination of factors-advanced dementia,  postictal phase following seizure episode and sepsis secondary to UTI. -IV rocephine -f/w BC and UC -pt per dter who has been at bedside has been eating some.  -per dter progressive slow decline.  -request she does not want pt to undergo any invasive w/u including any GI w/u given her advance age  17. Seizure episode, witnessed by her daughter. History of prior seizure disorder. Seen by Dr Katrinka Blazing -increased depakote dosage. Prn ativan -seizure precautions  3. Sepsis secondary to UTI. -BC , UC pending No fever  4. Advanced dementia, bedbound on total care dependent. Patient was under care of hospice in the past and discharged home a few months ago. dter say she does not need any HH help at present. She will take patient home  5. Hypertension, stable. Resume quinapril  6. Hematochezia hgb stable. No frank bleed. dter does not want GI eval at present.     Case discussed with Care Management/Social Worker. Management plans discussed with the family and they are in agreement.  CODE STATUS:DNR  DVT Prophylaxis SCD/TEDS  TOTAL TIME TAKING CARE OF THIS PATIENT: 40inutes.  >50% time spent on counselling and coordination of care  POSSIBLE D/C IN *1-2YS, DEPENDING ON CLINICAL CONDITION.   Calista Crain M.D on 10/07/2014 at 9:35 PM  Between 7am to 6pm - Pager - [phone]  After 6pm go to [email or web address] - password [email or web address] [facility]  [facility]  Office  [phone]  CC: Primary care physician; [facility]

## 2014-10-08 LAB — CBC
HCT: 32 % — ABNORMAL LOW (ref 35.0–47.0)
Hemoglobin: 11.1 g/dL — ABNORMAL LOW (ref 12.0–16.0)
MCH: 34.8 pg — ABNORMAL HIGH (ref 26.0–34.0)
MCHC: 34.8 g/dL (ref 32.0–36.0)
MCV: 99.9 fL (ref 80.0–100.0)
PLATELETS: 112 10*3/uL — AB (ref 150–440)
RBC: 3.21 MIL/uL — AB (ref 3.80–5.20)
RDW: 13.1 % (ref 11.5–14.5)
WBC: 8.5 10*3/uL (ref 3.6–11.0)

## 2014-10-08 MED ORDER — CEPHALEXIN 250 MG/5ML PO SUSR
500.0000 mg | Freq: Two times a day (BID) | ORAL | Status: DC
  Administered 2014-10-08: 500 mg via ORAL
  Filled 2014-10-08 (×2): qty 10

## 2014-10-08 MED ORDER — CEPHALEXIN 500 MG PO CAPS
500.0000 mg | ORAL_CAPSULE | Freq: Two times a day (BID) | ORAL | Status: DC
Start: 2014-10-08 — End: 2015-12-09

## 2014-10-08 MED ORDER — DIVALPROEX SODIUM 125 MG PO CSDR: 125.0000 mg | DELAYED_RELEASE_CAPSULE | Freq: Three times a day (TID) | ORAL | Status: DC

## 2014-10-08 NOTE — Progress Notes (Signed)
Pt VSS, No complaints of pain nor nausea; Tolerating diet; Pt received discharge orders. Instructions were reviewed with family. Instructions were reviewed with family. EMS notified. Pt discharged via stretcher to home.

## 2014-10-08 NOTE — Progress Notes (Signed)
Per MD patient is discharging home today and has no needs. RN Case Manager aware of above. Please reconsult if future social work needs arise. CSW signing off.   Jetta Lout, LCSWA [phone]

## 2014-10-08 NOTE — Discharge Summary (Signed)
[facility] - [place] at [facility]   PATIENT NAME: Katrina Gonzalez    MR#:  [id number]  DATE OF BIRTH:  10/15/1920  DATE OF ADMISSION:  10/07/2014 ADMITTING PHYSICIAN: Crissie Figures, MD  DATE OF DISCHARGE: 10/08/14  PRIMARY CARE PHYSICIAN: [facility]    ADMISSION DIAGNOSIS:  Delirium [R41.0] UTI (lower urinary tract infection) [N39.0] Altered mental status, unspecified altered mental status type [R41.82]  DISCHARGE DIAGNOSIS:  Ecoli UTI Advance dementia Bedbound SECONDARY DIAGNOSIS:   Past Medical History  Diagnosis Date  . Coronary artery disease   . Thyroid disease   . Stroke (HCC)   . Seizures (HCC)   . Hypertension     HOSPITAL COURSE:  81 y.o. female with a known history of advanced dementia, bedbound on total care dependent, hypertension, coronary artery disease, thyroid disease, prior CVA was brought in by EMS with the complaints of altered sensorium following a seizure episode witnessed by her daughter. According to the patient daughter who is with her at this time, she was noted to have eyes rolled up with staring look and mouth full of froth and unresponsive, hence   1. Acute encephalopathy secondary to combination of factors-advanced dementia, postictal phase following seizure episode and sepsis secondary to UTI. -IV rocephine -BC negative UC (per lab) Ecoli -pt per dter who has been at bedside has been eating some and more alert.  -per dter progressive slow decline.  -request she does not want pt to undergo any invasive w/u including any GI w/u given her advance age  6. Seizure episode, witnessed by her daughter. History of prior seizure disorder. Seen by Dr Katrinka Blazing -increased depakote dosage. Prn ativan -seizure precautions  3. Sepsis secondary to UTI. -BC negative , UC ecoli No fever  4. Advanced dementia, bedbound on total care dependent. Patient was under care of hospice in the past and  discharged home a few months ago. dter say she does not need any HH help at present. She will take patient home  5. Hypertension, stable. Resume quinapril  6. Hematochezia hgb stable. No frank bleed. dter does not want GI eval at present.  Overall improving slowly. Ok to d/c home later today. dter agreeable (nancy Belding) CONSULTS OBTAINED:   dr Katrinka Blazing neurology  DRUG ALLERGIES:  No Known Allergies  DISCHARGE MEDICATIONS:   Current Discharge Medication List    START taking these medications   Details  cephALEXin (KEFLEX) 500 MG capsule Take 1 capsule (500 mg total) by mouth 2 (two) times daily. Qty: 10 capsule, Refills: 0    divalproex (DEPAKOTE SPRINKLE) 125 MG capsule Take 1 capsule (125 mg total) by mouth every 8 (eight) hours. Qty: 90 capsule, Refills: 2      CONTINUE these medications which have NOT CHANGED   Details  divalproex (DEPAKOTE) 125 MG DR tablet Take 125 mg by mouth 2 (two) times daily.    fluticasone (FLONASE) 50 MCG/ACT nasal spray Place 1 spray into both nostrils daily.    levothyroxine (SYNTHROID, LEVOTHROID) 100 MCG tablet Take 100 mcg by mouth daily before breakfast.    LORazepam (ATIVAN) 0.5 MG tablet Take 0.5 mg by mouth every 4 (four) hours as needed for anxiety.    lovastatin (MEVACOR) 20 MG tablet Take 20 mg by mouth at bedtime.    Potassium Chloride CR (MICRO-K) 8 MEQ CPCR capsule CR Take 8 mEq by mouth daily.    quinapril (ACCUPRIL) 10 MG tablet Take 10 mg by mouth daily.  sennosides-docusate sodium (SENOKOT-S) 8.6-50 MG tablet Take 1 tablet by mouth at bedtime.        If you experience worsening of your admission symptoms, develop shortness of breath, life threatening emergency, suicidal or homicidal thoughts you must seek medical attention immediately by calling 911 or calling your MD immediately  if symptoms less severe.  You Must read complete instructions/literature along with all the possible adverse reactions/side effects for  all the Medicines you take and that have been prescribed to you. Take any new Medicines after you have completely understood and accept all the possible adverse reactions/side effects.   Please note  You were cared for by a hospitalist during your hospital stay. If you have any questions about your discharge medications or the care you received while you were in the hospital after you are discharged, you can call the unit and asked to speak with the hospitalist on call if the hospitalist that took care of you is not available. Once you are discharged, your primary care physician will handle any further medical issues. Please note that NO REFILLS for any discharge medications will be authorized once you are discharged, as it is imperative that you return to your primary care physician (or establish a relationship with a primary care physician if you do not have one) for your aftercare needs so that they can reassess your need for medications and monitor your lab values. Today   SUBJECTIVE   Eating breakfast being fed by dter. More alert. Talking some  VITAL SIGNS:  Blood pressure 118/61, pulse 100, temperature 98 F (36.7 C), temperature source Axillary, resp. rate 16, height 5\' 5"  (1.651 m), weight 63.504 kg (140 lb), SpO2 100 %.  I/O:   Intake/Output Summary (Last 24 hours) at 10/08/14 0946 Last data filed at 10/08/14 0424  Gross per 24 hour  Intake   2615 ml  Output      0 ml  Net   2615 ml    PHYSICAL EXAMINATION:  GENERAL:  81 y.o.-year-old patient lying in the bed with no acute distress.  EYES: Pupils equal, round, reactive to light and accommodation. No scleral icterus. Extraocular muscles intact.  HEENT: Head atraumatic, normocephalic. Oropharynx and nasopharynx clear.  NECK:  Supple, no jugular venous distention. No thyroid enlargement, no tenderness.  LUNGS: Normal breath sounds bilaterally, no wheezing, rales,rhonchi or crepitation. No use of accessory muscles of respiration.   CARDIOVASCULAR: S1, S2 normal. No murmurs, rubs, or gallops.  ABDOMEN: Soft, non-tender, non-distended. Bowel sounds present. No organomegaly or mass.  EXTREMITIES: No pedal edema, cyanosis, or clubbing.  NEUROLOGIC: unable to assess PSYCHIATRIC:  patient is alert .  SKIN: No obvious rash, lesion, or ulcer.   DATA REVIEW:   CBC   Recent Labs Lab 10/08/14 0638  WBC 8.5  HGB 11.1*  HCT 32.0*  PLT 112*    Chemistries   Recent Labs Lab 10/07/14 0452  NA 130*  K 4.0  CL 98*  CO2 25  GLUCOSE 130*  BUN 16  CREATININE 0.73  CALCIUM 8.5*  AST 37  ALT 23  ALKPHOS 52  BILITOT 0.9    Microbiology Results   Recent Results (from the past 240 hour(s))  Urine culture     Status: None (Preliminary result)   Collection Time: 10/07/14  2:40 AM  Result Value Ref Range Status   Specimen Description URINE, RANDOM  Final   Special Requests Normal  Final   Culture   Final    >=100,000 COLONIES/mL [name]  NEGATIVE RODS IDENTIFICATION AND SUSCEPTIBILITIES TO FOLLOW    Report Status PENDING  Incomplete  C difficile quick scan w PCR reflex     Status: None   Collection Time: 2014-10-20  3:57 AM  Result Value Ref Range Status   C Diff antigen NEGATIVE NEGATIVE Final   C Diff toxin NEGATIVE NEGATIVE Final   C Diff interpretation Negative for C. difficile  Final  Blood culture (routine x 2)     Status: None (Preliminary result)   Collection Time: 2014-10-20  4:38 AM  Result Value Ref Range Status   Specimen Description BLOOD LEFT FACE  Final   Special Requests BOTTLES DRAWN AEROBIC AND ANAEROBIC 3CC  Final   Culture NO GROWTH < 12 HOURS  Final   Report Status PENDING  Incomplete  Blood culture (routine x 2)     Status: None (Preliminary result)   Collection Time: October 20, 2014  4:52 AM  Result Value Ref Range Status   Specimen Description BLOOD  Final   Special Requests BOTTLES DRAWN AEROBIC AND ANAEROBIC 3CC  Final   Culture NO GROWTH < 12 HOURS  Final   Report Status PENDING   Incomplete    RADIOLOGY:  Ct Head Wo Contrast  10/20/14   CLINICAL DATA:  Witnessed seizure.  EXAM: CT HEAD WITHOUT CONTRAST  TECHNIQUE: Contiguous axial images were obtained from the base of the skull through the vertex without intravenous contrast.  COMPARISON:  01/30/2013  FINDINGS: There is no intracranial hemorrhage or extra-axial fluid collection. There is severe generalized atrophy. There is severe hemispheric white matter hypodensity which may represent chronic small vessel ischemic disease. There is more focally prominent hypodensity in the posterior left occipital lobe which is unchanged and this may represent remote infarction. No acute findings are evident. No bony abnormalities evident. Visible paranasal sinuses are clear.  IMPRESSION: Severe atrophy and chronic white matter disease. Probable remote left posterior occipital infarction. No acute findings.   Electronically Signed   By: Ellery Plunk M.D.   On: 20-Oct-2014 02:41   Dg Chest Portable 1 View  October 20, 2014   CLINICAL DATA:  Acute onset of seizure and decreased level of consciousness. Initial encounter.  EXAM: PORTABLE CHEST 1 VIEW  COMPARISON:  Chest radiograph performed 01/30/2013  FINDINGS: The lungs are well-aerated. Mild bibasilar atelectasis is noted. There is no evidence of pleural effusion or pneumothorax.  The cardiomediastinal silhouette is borderline normal in size. Degenerative change is noted at both glenohumeral joints, with sclerosis and prominent osteophytes.  IMPRESSION: Mild bibasilar atelectasis noted.  Lungs otherwise clear.   Electronically Signed   By: Roanna Raider M.D.   On: October 20, 2014 02:57     Management plans discussed with the patient, family and they are in agreement.  CODE STATUS:     Code Status Orders        Start     Ordered   October 20, 2014 0732  Do not attempt resuscitation (DNR)   Continuous    Question Answer Comment  In the event of cardiac or respiratory ARREST Do not call a "code  blue"   In the event of cardiac or respiratory ARREST Do not perform Intubation, CPR, defibrillation or ACLS   In the event of cardiac or respiratory ARREST Use medication by any route, position, wound care, and other measures to relive pain and suffering. May use oxygen, suction and manual treatment of airway obstruction as needed for comfort.   Comments RN may pronounce death      20-Oct-2014 [phone]  TOTAL TIME TAKING CARE OF THIS PATIENT: 40 minutes.    Asaad Gulley M.D on 10/08/2014 at 9:46 AM  Between 7am to 6pm - Pager - [phone] After 6pm go to [email or web address] - password [email or web address] [facility]  [place] [facility]  Office  [phone]  CC: Primary care physician; Inc [facility]

## 2014-10-08 NOTE — Progress Notes (Signed)
Spoke with Dr. Anne Hahn about telemetry. Dr. Anne Hahn stating that it is ok to d/C telemetry.

## 2014-10-08 NOTE — Care Management (Signed)
Met with patient's daughter who was recently here at [facility] for joint replacement. She states she and patient are doing well. She states she has an abundant amount of help through family members. She denies need for home health health. She is eager to take her mom/patient home.  EMS packet completed. No further RNCM needs. Case closed.

## 2014-10-09 LAB — URINE CULTURE: Special Requests: NORMAL

## 2014-10-09 NOTE — ED Notes (Signed)
Lab called to report Gram positive cocci in cluster in the aerobic bottle in one set. Will notify MD

## 2014-10-09 NOTE — Care Management (Signed)
Post discharge: Lab had notified Katrina Gonzalez in [facility] ED regarding positive blood culture obtained. Dr. Enedina Finner will review lab.

## 2014-10-10 LAB — CULTURE, BLOOD (ROUTINE X 2)

## 2014-10-12 LAB — CULTURE, BLOOD (ROUTINE X 2): CULTURE: NO GROWTH

## 2015-12-05 ENCOUNTER — Encounter: Payer: Self-pay | Admitting: *Deleted

## 2015-12-05 ENCOUNTER — Emergency Department: Payer: Medicare PPO

## 2015-12-05 ENCOUNTER — Inpatient Hospital Stay
Admission: EM | Admit: 2015-12-05 | Discharge: 2015-12-09 | DRG: 177 | Disposition: A | Discharge status: Hospice | Payer: Medicare PPO | Attending: Internal Medicine | Admitting: Internal Medicine

## 2015-12-05 DIAGNOSIS — I10 Essential (primary) hypertension: Secondary | ICD-10-CM | POA: Diagnosis present

## 2015-12-05 DIAGNOSIS — Z8673 Personal history of transient ischemic attack (TIA), and cerebral infarction without residual deficits: Secondary | ICD-10-CM

## 2015-12-05 DIAGNOSIS — J690 Pneumonitis due to inhalation of food and vomit: Principal | ICD-10-CM | POA: Diagnosis present

## 2015-12-05 DIAGNOSIS — Z7189 Other specified counseling: Secondary | ICD-10-CM

## 2015-12-05 DIAGNOSIS — Z993 Dependence on wheelchair: Secondary | ICD-10-CM | POA: Diagnosis not present

## 2015-12-05 DIAGNOSIS — R1310 Dysphagia, unspecified: Secondary | ICD-10-CM | POA: Diagnosis present

## 2015-12-05 DIAGNOSIS — E876 Hypokalemia: Secondary | ICD-10-CM | POA: Diagnosis present

## 2015-12-05 DIAGNOSIS — F0390 Unspecified dementia without behavioral disturbance: Secondary | ICD-10-CM | POA: Diagnosis present

## 2015-12-05 DIAGNOSIS — R0602 Shortness of breath: Secondary | ICD-10-CM

## 2015-12-05 DIAGNOSIS — Z66 Do not resuscitate: Secondary | ICD-10-CM | POA: Diagnosis present

## 2015-12-05 DIAGNOSIS — N390 Urinary tract infection, site not specified: Secondary | ICD-10-CM | POA: Diagnosis present

## 2015-12-05 DIAGNOSIS — G9341 Metabolic encephalopathy: Secondary | ICD-10-CM | POA: Diagnosis present

## 2015-12-05 DIAGNOSIS — E871 Hypo-osmolality and hyponatremia: Secondary | ICD-10-CM | POA: Diagnosis present

## 2015-12-05 DIAGNOSIS — Z515 Encounter for palliative care: Secondary | ICD-10-CM | POA: Diagnosis present

## 2015-12-05 DIAGNOSIS — I2510 Atherosclerotic heart disease of native coronary artery without angina pectoris: Secondary | ICD-10-CM | POA: Diagnosis present

## 2015-12-05 DIAGNOSIS — J9601 Acute respiratory failure with hypoxia: Secondary | ICD-10-CM | POA: Diagnosis present

## 2015-12-05 DIAGNOSIS — J189 Pneumonia, unspecified organism: Secondary | ICD-10-CM

## 2015-12-05 DIAGNOSIS — G40909 Epilepsy, unspecified, not intractable, without status epilepticus: Secondary | ICD-10-CM | POA: Diagnosis present

## 2015-12-05 DIAGNOSIS — Z8249 Family history of ischemic heart disease and other diseases of the circulatory system: Secondary | ICD-10-CM

## 2015-12-05 DIAGNOSIS — E079 Disorder of thyroid, unspecified: Secondary | ICD-10-CM | POA: Diagnosis present

## 2015-12-05 DIAGNOSIS — Z79899 Other long term (current) drug therapy: Secondary | ICD-10-CM

## 2015-12-05 DIAGNOSIS — J9691 Respiratory failure, unspecified with hypoxia: Secondary | ICD-10-CM | POA: Diagnosis present

## 2015-12-05 DIAGNOSIS — R8281 Pyuria: Secondary | ICD-10-CM

## 2015-12-05 LAB — URINALYSIS COMPLETE WITH MICROSCOPIC (ARMC ONLY)
Bilirubin Urine: NEGATIVE
Glucose, UA: NEGATIVE mg/dL
Hgb urine dipstick: NEGATIVE
Ketones, ur: NEGATIVE mg/dL
Nitrite: NEGATIVE
Protein, ur: NEGATIVE mg/dL
Specific Gravity, Urine: 1.016 (ref 1.005–1.030)
pH: 6 (ref 5.0–8.0)

## 2015-12-05 LAB — BASIC METABOLIC PANEL WITH GFR
Anion gap: 8 (ref 5–15)
BUN: 12 mg/dL (ref 6–20)
CO2: 26 mmol/L (ref 22–32)
Calcium: 7.8 mg/dL — ABNORMAL LOW (ref 8.9–10.3)
Chloride: 88 mmol/L — ABNORMAL LOW (ref 101–111)
Creatinine, Ser: 0.37 mg/dL — ABNORMAL LOW (ref 0.44–1.00)
GFR calc Af Amer: >60 mL/min
GFR calc non Af Amer: >60 mL/min
Glucose, Bld: 91 mg/dL (ref 65–99)
Potassium: 4.5 mmol/L (ref 3.5–5.1)
Sodium: 122 mmol/L — ABNORMAL LOW (ref 135–145)

## 2015-12-05 LAB — CBC
HEMATOCRIT: 31.9 % — AB (ref 35.0–47.0)
Hemoglobin: 11 g/dL — ABNORMAL LOW (ref 12.0–16.0)
MCH: 34.5 pg — ABNORMAL HIGH (ref 26.0–34.0)
MCHC: 34.5 g/dL (ref 32.0–36.0)
MCV: 99.8 fL (ref 80.0–100.0)
PLATELETS: 122 10*3/uL — AB (ref 150–440)
RBC: 3.19 MIL/uL — ABNORMAL LOW (ref 3.80–5.20)
RDW: 13.5 % (ref 11.5–14.5)
WBC: 6.3 10*3/uL (ref 3.6–11.0)

## 2015-12-05 LAB — TROPONIN I: Troponin I: <0.03 ng/mL

## 2015-12-05 LAB — BRAIN NATRIURETIC PEPTIDE: B NATRIURETIC PEPTIDE 5: 53 pg/mL (ref 0.0–100.0)

## 2015-12-05 LAB — MAGNESIUM: MAGNESIUM: 1.1 mg/dL — AB (ref 1.7–2.4)

## 2015-12-05 LAB — LACTIC ACID, PLASMA: LACTIC ACID, VENOUS: 1.1 mmol/L (ref 0.5–1.9)

## 2015-12-05 MED ORDER — MAGNESIUM SULFATE 2 GM/50ML IV SOLN
2.0000 g | Freq: Once | INTRAVENOUS | Status: AC
Start: 2015-12-05 — End: 2015-12-05
  Administered 2015-12-05: 2 g via INTRAVENOUS
  Filled 2015-12-05: qty 50

## 2015-12-05 MED ORDER — ONDANSETRON HCL 4 MG/2ML IJ SOLN: 4.0000 mg | Freq: Four times a day (QID) | INTRAMUSCULAR | Status: DC | PRN

## 2015-12-05 MED ORDER — ENOXAPARIN SODIUM 30 MG/0.3ML ~~LOC~~ SOLN
30.0000 mg | SUBCUTANEOUS | Status: DC
  Administered 2015-12-05 – 2015-12-06 (×2): 30 mg via SUBCUTANEOUS
  Filled 2015-12-05 (×2): qty 0.3

## 2015-12-05 MED ORDER — SODIUM CHLORIDE 0.9 % IV SOLN
3.0000 g | Freq: Four times a day (QID) | INTRAVENOUS | Status: DC
  Administered 2015-12-05 – 2015-12-08 (×11): 3 g via INTRAVENOUS
  Filled 2015-12-05 (×14): qty 3

## 2015-12-05 MED ORDER — ALBUTEROL SULFATE (2.5 MG/3ML) 0.083% IN NEBU
2.5000 mg | INHALATION_SOLUTION | RESPIRATORY_TRACT | Status: DC
  Administered 2015-12-05 – 2015-12-08 (×19): 2.5 mg via RESPIRATORY_TRACT
  Filled 2015-12-05 (×19): qty 3

## 2015-12-05 MED ORDER — SODIUM CHLORIDE 0.9 % IV SOLN
1000.0000 mL | INTRAVENOUS | Status: DC
  Administered 2015-12-05: 1000 mL via INTRAVENOUS

## 2015-12-05 MED ORDER — LISINOPRIL 10 MG PO TABS
10.0000 mg | ORAL_TABLET | Freq: Every day | ORAL | Status: DC
  Administered 2015-12-07 – 2015-12-08 (×2): 10 mg via ORAL
  Filled 2015-12-05 (×2): qty 1

## 2015-12-05 MED ORDER — SODIUM CHLORIDE 0.9 % IV SOLN
INTRAVENOUS | Status: AC
  Administered 2015-12-05: 20:00:00 via INTRAVENOUS

## 2015-12-05 MED ORDER — ONDANSETRON HCL 4 MG PO TABS: 4.0000 mg | ORAL_TABLET | Freq: Four times a day (QID) | ORAL | Status: DC | PRN

## 2015-12-05 MED ORDER — LEVOTHYROXINE SODIUM 100 MCG PO TABS
100.0000 ug | ORAL_TABLET | Freq: Every day | ORAL | Status: DC
  Administered 2015-12-07 – 2015-12-09 (×3): 100 ug via ORAL
  Filled 2015-12-05 (×3): qty 1

## 2015-12-05 MED ORDER — ACETAMINOPHEN 325 MG PO TABS: 650.0000 mg | ORAL_TABLET | Freq: Four times a day (QID) | ORAL | Status: DC | PRN

## 2015-12-05 MED ORDER — PRAVASTATIN SODIUM 20 MG PO TABS
20.0000 mg | ORAL_TABLET | Freq: Every day | ORAL | Status: DC
  Administered 2015-12-06 – 2015-12-08 (×3): 20 mg via ORAL
  Filled 2015-12-05 (×3): qty 1

## 2015-12-05 MED ORDER — ACETAMINOPHEN 650 MG RE SUPP: 650.0000 mg | Freq: Four times a day (QID) | RECTAL | Status: DC | PRN

## 2015-12-05 MED ORDER — ALBUTEROL SULFATE (2.5 MG/3ML) 0.083% IN NEBU
5.0000 mg | INHALATION_SOLUTION | Freq: Once | RESPIRATORY_TRACT | Status: AC
  Administered 2015-12-05: 5 mg via RESPIRATORY_TRACT
  Filled 2015-12-05: qty 6

## 2015-12-05 MED ORDER — SENNOSIDES-DOCUSATE SODIUM 8.6-50 MG PO TABS
1.0000 | ORAL_TABLET | Freq: Every day | ORAL | Status: DC
  Administered 2015-12-06 – 2015-12-08 (×3): 1 via ORAL
  Filled 2015-12-05 (×3): qty 1

## 2015-12-05 MED ORDER — DIVALPROEX SODIUM 125 MG PO CSDR
125.0000 mg | DELAYED_RELEASE_CAPSULE | Freq: Three times a day (TID) | ORAL | Status: DC
  Administered 2015-12-06 – 2015-12-09 (×9): 125 mg via ORAL
  Filled 2015-12-05 (×10): qty 1

## 2015-12-05 MED ORDER — SODIUM CHLORIDE 0.9 % IV SOLN
3.0000 g | Freq: Once | INTRAVENOUS | Status: AC
  Administered 2015-12-05: 3 g via INTRAVENOUS
  Filled 2015-12-05: qty 3

## 2015-12-05 NOTE — ED Provider Notes (Signed)
[facility] Emergency Department Provider Note    First MD Initiated Contact with Patient 12/05/15 1037     (approximate)  I have reviewed the triage vital signs and the nursing notes.   HISTORY  Chief Complaint Cough; Nasal Congestion; and Shortness of Breath  Level V Caveat:  Severe dementia -non verbal  HPI Katrina Gonzalez is a 82 y.o. female who presents with 3 days of worsening cough, shortness of breath labored breathing and nasal congestion. Per family patient has a history of stroke as well as seizures. She does not wear home oxygen. When EMS on her she was hypoxic and requiring 3 L nasal cannula on arrival to the ER with the pulse ox of only 90%. Family denies any measured fevers but states that she is more drowsy than normal. He is usually fairly highly functioning and is able to feed herself. States that she's had a very productive cough over the past 2 days. No recent admissions to hospital. No recent antibiotics.   Past Medical History:  Diagnosis Date  . Coronary artery disease   . Hypertension   . Seizures (HCC)   . Stroke (HCC)   . Thyroid disease    Family History  Problem Relation Age of Onset  . Heart disease Father   . Hypertension Father    Past Surgical History:  Procedure Laterality Date  . ABDOMINAL HYSTERECTOMY    . FRACTURE SURGERY     Patient Active Problem List   Diagnosis Date Noted  . Aspiration pneumonitis (HCC) 12/05/2015  . Acute metabolic encephalopathy 12/05/2015  . Dysphagia 12/05/2015  . Hyponatremia 12/05/2015  . Pyuria 12/05/2015  . Respiratory failure with hypoxia (HCC) 12/05/2015  . Acute encephalopathy 10/07/2014  . Seizure (HCC) 10/07/2014  . Sepsis secondary to UTI (HCC) 10/07/2014  . Dementia 10/07/2014      Prior to Admission medications   Medication Sig Start Date End Date Taking? Authorizing Provider  divalproex (DEPAKOTE) 125 MG DR tablet Take 125 mg by mouth 2 (two) times daily.   Yes  Historical Provider, MD  levothyroxine (SYNTHROID, LEVOTHROID) 100 MCG tablet Take 100 mcg by mouth daily before breakfast.   Yes Historical Provider, MD  lovastatin (MEVACOR) 20 MG tablet Take 20 mg by mouth at bedtime.   Yes Historical Provider, MD  Potassium Chloride CR (MICRO-K) 8 MEQ CPCR capsule CR Take 8 mEq by mouth daily.   Yes Historical Provider, MD  quinapril (ACCUPRIL) 10 MG tablet Take 10 mg by mouth daily.   Yes Historical Provider, MD  sennosides-docusate sodium (SENOKOT-S) 8.6-50 MG tablet Take 1 tablet by mouth at bedtime.   Yes Historical Provider, MD  cephALEXin (KEFLEX) 500 MG capsule Take 1 capsule (500 mg total) by mouth 2 (two) times daily. Patient not taking: Reported on 12/05/2015 10/08/14   Enedina FinnerSona Patel, MD  divalproex (DEPAKOTE SPRINKLE) 125 MG capsule Take 1 capsule (125 mg total) by mouth every 8 (eight) hours. Patient not taking: Reported on 12/05/2015 10/08/14   Enedina FinnerSona Patel, MD  LORazepam (ATIVAN) 0.5 MG tablet Take 0.5 mg by mouth every 2 (two) hours.     Historical Provider, MD    Allergies Patient has no known allergies.    Social History Social History  Substance Use Topics  . Smoking status: Never Smoker  . Smokeless tobacco: Not on file  . Alcohol use No    Review of Systems Patient denies headaches, rhinorrhea, blurry vision, numbness, shortness of breath, chest pain, edema, cough, abdominal  pain, nausea, vomiting, diarrhea, dysuria, fevers, rashes or hallucinations unless otherwise stated above in HPI. ____________________________________________   PHYSICAL EXAM:  VITAL SIGNS: Vitals:   12/05/15 1200 12/05/15 1300  BP: (!) 165/90 (!) 163/88  Pulse: 83 91  Resp: (!) 26 16  Temp:      Constitutional: Alert but very ill appearing, cachectic, frail Eyes: Conjunctivae are normal. PERRL. EOMI. Head: Atraumatic. Nose: No congestion/rhinnorhea. Mouth/Throat: Mucous membranes are moist.  Oropharynx non-erythematous. Neck: No stridor. No  JVD Hematological/Lymphatic/Immunilogical: No cervical lymphadenopathy. Cardiovascular: Normal rate, regular rhythm. Grossly normal heart sounds.  Good peripheral circulation. Respiratory: Mildly to The mid 20s with use of accessory muscles. Diffuse rhonchorous breath sounds as well as upper airway congestion transmitted distally  Gastrointestinal: Soft and nontender. No distention. No abdominal bruits. No CVA tenderness Musculoskeletal: No lower extremity tenderness nor edema.  No joint effusions. Neurologic:  Limited neuro exam due to severe dementia. Left upper extremity and chronic contracture to the left lower extremity. Skin:  Skin is warm, dry and intact. No rash noted. Psychiatric:  ____________________________________________   LABS (all labs ordered are listed, but only abnormal results are displayed)  Results for orders placed or performed during the hospital encounter of 12/05/15 (from the past 24 hour(s))  Basic metabolic panel     Status: Abnormal   Collection Time: 12/05/15 10:36 AM  Result Value Ref Range   Sodium 122 (L) 135 - 145 mmol/L   Potassium 4.5 3.5 - 5.1 mmol/L   Chloride 88 (L) 101 - 111 mmol/L   CO2 26 22 - 32 mmol/L   Glucose, Bld 91 65 - 99 mg/dL   BUN 12 6 - 20 mg/dL   Creatinine, Ser 1.61 (L) 0.44 - 1.00 mg/dL   Calcium 7.8 (L) 8.9 - 10.3 mg/dL   GFR calc non Af Amer >60 >60 mL/min   GFR calc Af Amer >60 >60 mL/min   Anion gap 8 5 - 15  CBC     Status: Abnormal   Collection Time: 12/05/15 10:36 AM  Result Value Ref Range   WBC 6.3 3.6 - 11.0 K/uL   RBC 3.19 (L) 3.80 - 5.20 MIL/uL   Hemoglobin 11.0 (L) 12.0 - 16.0 g/dL   HCT 09.6 (L) 04.5 - 40.9 %   MCV 99.8 80.0 - 100.0 fL   MCH 34.5 (H) 26.0 - 34.0 pg   MCHC 34.5 32.0 - 36.0 g/dL   RDW 81.1 91.4 - 78.2 %   Platelets 122 (L) 150 - 440 K/uL  Magnesium     Status: Abnormal   Collection Time: 12/05/15 10:36 AM  Result Value Ref Range   Magnesium 1.1 (L) 1.7 - 2.4 mg/dL  Troponin I     Status:  None   Collection Time: 12/05/15 10:36 AM  Result Value Ref Range   Troponin I <0.03 <0.03 ng/mL  Lactic acid, plasma     Status: None   Collection Time: 12/05/15 11:40 AM  Result Value Ref Range   Lactic Acid, Venous 1.1 0.5 - 1.9 mmol/L  Urinalysis complete, with microscopic ([facility] only)     Status: Abnormal   Collection Time: 12/05/15 11:40 AM  Result Value Ref Range   Color, Urine YELLOW (A) YELLOW   APPearance CLEAR (A) CLEAR   Glucose, UA NEGATIVE NEGATIVE mg/dL   Bilirubin Urine NEGATIVE NEGATIVE   Ketones, ur NEGATIVE NEGATIVE mg/dL   Specific Gravity, Urine 1.016 1.005 - 1.030   Hgb urine dipstick NEGATIVE NEGATIVE   pH 6.0 5.0 -  8.0   Protein, ur NEGATIVE NEGATIVE mg/dL   Nitrite NEGATIVE NEGATIVE   Leukocytes, UA 2+ (A) NEGATIVE   RBC / HPF 0-5 0 - 5 RBC/hpf   WBC, UA 6-30 0 - 5 WBC/hpf   Bacteria, UA RARE (A) NONE SEEN   Squamous Epithelial / LPF 0-5 (A) NONE SEEN   Mucous PRESENT    ____________________________________________  EKG My review and personal interpretation at Time: 10:33   Indication: weakness  Rate: 85  Rhythm: sinus Axis: left Other: non specific st changes, no acute t wave abnormalities ____________________________________________  RADIOLOGY  I personally reviewed all radiographic images ordered to evaluate for the above acute complaints and reviewed radiology reports and findings.  These findings were personally discussed with the patient.  Please see medical record for radiology report.  ____________________________________________   PROCEDURES  Procedure(s) performed: none Procedures    Critical Care performed: yes CRITICAL CARE Performed by: Willy EddyPatrick Kern Gingras   Total critical care time: 45 minutes  Critical care time was exclusive of separately billable procedures and treating other patients.  Critical care was necessary to treat or prevent imminent or life-threatening deterioration.  Critical care was time spent personally  by me on the following activities: development of treatment plan with patient and/or surrogate as well as nursing, discussions with consultants, evaluation of patient's response to treatment, examination of patient, obtaining history from patient or surrogate, ordering and performing treatments and interventions, ordering and review of laboratory studies, ordering and review of radiographic studies, pulse oximetry and re-evaluation of patient's condition.  ____________________________________________   INITIAL IMPRESSION / ASSESSMENT AND PLAN / ED COURSE  Pertinent labs & imaging results that were available during my care of the patient were reviewed by me and considered in my medical decision making (see chart for details).  DDX: Asthma, copd, CHF, pna, ptx, malignancy, Pe, anemia   Meggen WS Toni Gonzalez is a 82 y.o. who presents to the ED with chief complaint of congestion, shortness of breath has worsened over the past 3 days. Patient does arrive with acute hypoxic respiratory failure requiring supplemental oxygen 3 L nasal cannula. Her exam is concerning for pneumonia probably secondary to aspiration given her history of stroke. She is currently afebrile and otherwise hemodynamically stable but is demonstrating tachypnea.  The patient will be placed on continuous pulse oximetry and telemetry for monitoring.  Laboratory evaluation will be sent to evaluate for the above complaints.     Clinical Course as of Dec 05 1423  Fri Dec 05, 2015  1251 Magnesium is currently low 1.1. Will give replacement with IV magnesium. Currently awaiting urine analysis. If normally do suspect this is secondary to aspiration pneumonitis for her acute hypoxic respiratory failure patient with improvement in symptoms after albuterol treatments.  [PR]    Clinical Course User Index [PR] Willy EddyPatrick Derrall Hicks, MD   ----------------------------------------- 2:25 PM on  12/05/2015 -----------------------------------------  Patient will require admission for further evaluation and management.Have discussed with the patient and available family all diagnostics and treatments performed thus far and all questions were answered to the best of my ability. The patient demonstrates understanding and agreement with plan.   ____________________________________________   FINAL CLINICAL IMPRESSION(S) / ED DIAGNOSES  Final diagnoses:  SOB (shortness of breath)  Acute respiratory failure with hypoxemia (HCC)  Aspiration pneumonia of right lower lobe due to vomit (HCC)  Hyponatremia  Hypomagnesemia      NEW MEDICATIONS STARTED DURING THIS VISIT:  New Prescriptions   No medications on file  Note:  This document was prepared using Dragon voice recognition software and may include unintentional dictation errors.    Willy Eddy, MD 12/05/15 1425

## 2015-12-05 NOTE — ED Triage Notes (Signed)
Pt arrives via EMS from home, family states pt has been congested for several days but today developed a cough and SOB, pt arrives on 3L  from EMS, lungs rhonchi, pt non-verbal at baseline

## 2015-12-05 NOTE — Progress Notes (Signed)
Pharmacy Antibiotic Note  Ludmilla WS Toni ArthursFuller is a 82 y.o. female admitted on 12/05/2015 with aspiration PNA. Pharmacy has been consulted for ampicillin-sulbactam dosing. Pt received ampicillin-sulbactam 3 g IV x 1 CXR: Bibasilar atelectasis/ infiltrate and small bilateral effusions  Plan: Will initiate ampicillin-sulbactam 3 g IV q 6 hours starting 6 hours after first dose  Height: 5\' 4"  (162.6 cm) Weight: 140 lb (63.5 kg) IBW/kg (Calculated) : 54.7  Temp (24hrs), Avg:98.6 F (37 C), Min:98.4 F (36.9 C), Max:98.8 F (37.1 C)   Recent Labs Lab 12/05/15 1036 12/05/15 1140  WBC 6.3  --   CREATININE 0.37*  --   LATICACIDVEN  --  1.1    Estimated Creatinine Clearance: 37.9 mL/min (by C-G formula based on SCr of 0.37 mg/dL (L)).    No Known Allergies  Antimicrobials this admission: Unasyn 12/1 >>  Dose adjustments this admission:   Microbiology results: 12/01 BCx: pending 12/01 UCx: pending  12/01 Sputum: pending   MRSA PCR:   Thank you for allowing pharmacy to be a part of this patient's care.  Horris LatinoHolly Gilliam, PharmD Pharmacy Resident 12/05/2015 6:59 PM

## 2015-12-05 NOTE — H&P (Addendum)
[facility] - [place] [facility]   PATIENT NAME: Katrina Gonzalez    MR#:  [id number]  DATE OF BIRTH:  12-Nov-1920  DATE OF ADMISSION:  12/05/2015  PRIMARY CARE PHYSICIAN: [facility]   REQUESTING/REFERRING PHYSICIAN:   CHIEF COMPLAINT:   Chief Complaint  Patient presents with  . Cough  . Nasal Congestion  . Shortness of Breath    HISTORY OF PRESENT ILLNESS: Katrina Gonzalez  is a 82 y.o. female with a known history of Coronary artery disease, hypertension, seizure disorder, stroke, thyroid disease, who presents to the hospital with complaints of rattling in the chest, cough, weakness. Upon to patient's daughter, patient has been having rattling in the chest for the past 1 week, which is getting worse, she also admitted of dysphagia symptoms of choking events tablet on the foot intermittently holding liquid in the mouth, until he drips. On arrival to the hospital patient was noted to have hypoxemia with O2 sats of 88% on room air, she was initiated on 3 L of oxygen through nasal cannula and her O2 sats improved to 100%. Chest x-ray revealed bibasilar atelectasis/infiltrates. Hospitalist services were contacted for admission. Patient herself is not able to provide review of systems due to altered mental status  PAST MEDICAL HISTORY:   Past Medical History:  Diagnosis Date  . Coronary artery disease   . Hypertension   . Seizures (HCC)   . Stroke (HCC)   . Thyroid disease     PAST SURGICAL HISTORY: Past Surgical History:  Procedure Laterality Date  . ABDOMINAL HYSTERECTOMY    . FRACTURE SURGERY      SOCIAL HISTORY:  Social History  Substance Use Topics  . Smoking status: Never Smoker  . Smokeless tobacco: Not on file  . Alcohol use No    FAMILY HISTORY:  Family History  Problem Relation Age of Onset  . Heart disease Father   . Hypertension Father     DRUG ALLERGIES: No Known Allergies  Review of Systems  Unable to  perform ROS: Mental acuity    MEDICATIONS AT HOME:  Prior to Admission medications   Medication Sig Start Date End Date Taking? Authorizing Provider  divalproex (DEPAKOTE) 125 MG DR tablet Take 125 mg by mouth 2 (two) times daily.   Yes Historical Provider, MD  levothyroxine (SYNTHROID, LEVOTHROID) 100 MCG tablet Take 100 mcg by mouth daily before breakfast.   Yes Historical Provider, MD  lovastatin (MEVACOR) 20 MG tablet Take 20 mg by mouth at bedtime.   Yes Historical Provider, MD  Potassium Chloride CR (MICRO-K) 8 MEQ CPCR capsule CR Take 8 mEq by mouth daily.   Yes Historical Provider, MD  quinapril (ACCUPRIL) 10 MG tablet Take 10 mg by mouth daily.   Yes Historical Provider, MD  sennosides-docusate sodium (SENOKOT-S) 8.6-50 MG tablet Take 1 tablet by mouth at bedtime.   Yes Historical Provider, MD  cephALEXin (KEFLEX) 500 MG capsule Take 1 capsule (500 mg total) by mouth 2 (two) times daily. Patient not taking: Reported on 12/05/2015 10/08/14   Enedina Finner, MD  divalproex (DEPAKOTE SPRINKLE) 125 MG capsule Take 1 capsule (125 mg total) by mouth every 8 (eight) hours. Patient not taking: Reported on 12/05/2015 10/08/14   Enedina Finner, MD  LORazepam (ATIVAN) 0.5 MG tablet Take 0.5 mg by mouth every 2 (two) hours.     Historical Provider, MD      PHYSICAL EXAMINATION:   VITAL SIGNS: Blood pressure (!) 163/88, pulse  91, temperature 98.8 F (37.1 C), temperature source Oral, resp. rate 16, height 5\' 4"  (1.626 m), weight 63.5 kg (140 lb), SpO2 98 %.  GENERAL:  82 y.o.-year-old patient lying in the bed In mild respiratory distress, tachypneic, but somnolent and barely is able to open eyes. Nonverbal. The patient is not able to follow commands EYES: Pupils equal, round, reactive to light and accommodation. No scleral icterus. Extraocular muscles intact.  HEENT: Head atraumatic, normocephalic. Oropharynx and nasopharynx clear.  NECK:  Supple, no jugular venous distention. No thyroid enlargement, no  tenderness.  LUNGS: Diminished breath sounds bilaterally, no wheezing, bilateral scattered rales,rhonchi and crepitations. Intermittent use of accessory muscles of respiration, intermittent dry cough, very poor effort/strength.  CARDIOVASCULAR: S1, S2 normal. No murmurs, rubs, or gallops.  ABDOMEN: Soft, nontender, nondistended. Bowel sounds present. No organomegaly or mass.  EXTREMITIES: No pedal edema, cyanosis, or clubbing.  NEUROLOGIC: Cranial nerves II through XII are grossly intact, but evaluation is minimal. Muscle strength. Unable to assess in all extremities, contracted upper extremities. Sensation unable to assess. Gait not checked.  PSYCHIATRIC: The patient is very somnolent, unable to wake her up, except of with the gentle shake and not able to assess her orientation, patient is nonverbal  SKIN: No obvious rash, lesion, or ulcer.   LABORATORY PANEL:   CBC  Recent Labs Lab 12/05/15 1036  WBC 6.3  HGB 11.0*  HCT 31.9*  PLT 122*  MCV 99.8  MCH 34.5*  MCHC 34.5  RDW 13.5   ------------------------------------------------------------------------------------------------------------------  Chemistries   Recent Labs Lab 12/05/15 1036  NA 122*  K 4.5  CL 88*  CO2 26  GLUCOSE 91  BUN 12  CREATININE 0.37*  CALCIUM 7.8*  MG 1.1*   ------------------------------------------------------------------------------------------------------------------  Cardiac Enzymes  Recent Labs Lab 12/05/15 1036  TROPONINI <0.03   ------------------------------------------------------------------------------------------------------------------  RADIOLOGY: Dg Chest Portable 1 View  Result Date: 12/05/2015 CLINICAL DATA:  Short of breath EXAM: PORTABLE CHEST 1 VIEW COMPARISON:  10/07/2014 FINDINGS: Cardiac enlargement. Negative for heart failure. Bibasilar atelectasis/ infiltrate has developed in the interval. Possible small pleural effusions. Advanced degenerative change in both  shoulder joints IMPRESSION: Bibasilar atelectasis/ infiltrate and small bilateral effusions. Electronically Signed   By: Marlan Palauharles  Clark M.D.   On: 12/05/2015 11:18    EKG: Orders placed or performed during the hospital encounter of 12/05/15  . ED EKG  . ED EKG  . EKG 12-Lead  . EKG 12-Lead  EKG in the emergency room reveals sinus rhythm at 85 intermittent abnormal R-wave progression, and a transition, left ventricular hypertrophy per EKG criteria, baseline wander in leads V4, V6, no acute ST changes  IMPRESSION AND PLAN:  Principal Problem:   Aspiration pneumonitis (HCC) Active Problems:   Acute metabolic encephalopathy   Dysphagia   Hyponatremia   Pyuria   Respiratory failure with hypoxia (HCC)  #1. Acute respiratory failure with hypoxia due to aspiration pneumonitis, continue oxygen therapy, weaning off as tolerated, get palliative care involved, Get BNP to rule out underlying CHF.  #2. Aspiration pneumonitis, speech therapist involved, continue nothing by mouth for now except of medications, initiate Unasyn, get sputum cultures if possible, get physical therapy by RT #3 metabolic encephalopathy, supportive therapy, speech therapist evaluation #4. Dysphagia, nothing by mouth, except of medications for now, SLP evaluation #5. Pyuria, questionable urinary tract infection, get urine culture, continue Unasyn #6. Hyponatremia, possibly dehydration, inadequate oral intake, initiate patient on IV fluids, follow a sodium level in the morning  All the records are reviewed  and case discussed with ED provider. Management plans discussed with the patient, family and they are in agreement.  CODE STATUS: Code Status History    Date Active Date Inactive Code Status Order ID Comments User Context   10/07/2014  7:31 AM 10/08/2014  3:28 PM DNR [id number]  Crissie FiguresEdavally N Reddy, MD Inpatient    Questions for Most Recent Historical Code Status (Order [id number])    Question Answer Comment   In the event  of cardiac or respiratory ARREST Do not call a "code blue"    In the event of cardiac or respiratory ARREST Do not perform Intubation, CPR, defibrillation or ACLS    In the event of cardiac or respiratory ARREST Use medication by any route, position, wound care, and other measures to relive pain and suffering. May use oxygen, suction and manual treatment of airway obstruction as needed for comfort.    Comments RN may pronounce death        TOTAL TIME TAKING CARE OF THIS PATIENT: 50 minutes.     Advance care planning:   Patient's medical condition, treatment plan was discussed with the patient's daughters, including the possibility that her condition would not improve, CODE STATUS, patient's family stated that they wanted patient to be DO NOT RESUSCITATE, I asked him to bring the document from home, which they promised to do, patient will be DO NOT RESUSCITATE,  family agreed to palliative care involvement in the hospital  Time spent 15 minutes on discussions          Dex Blakely M.D on 12/05/2015 at 1:57 PM  Between 7am to 6pm - Pager - [phone] After 6pm go to [email or web address] - password [email or web address]  [place] [facility]  Office  [phone]  CC: Primary care physician; [facility]

## 2015-12-05 NOTE — Progress Notes (Signed)
Palliative:  Thank you for this consult. Due to high census and no palliative service on the weekend we will not be able to consult until Monday. We are sorry for this inconvenience.   No Charge  Kamaree Berkel, NP Palliative Medicine Team Pager # [phone] (M-F 8a-5p) Team Phone # [phone] (Nights/Weekends)  

## 2015-12-06 ENCOUNTER — Encounter: Payer: Self-pay | Admitting: Cardiology

## 2015-12-06 LAB — BLOOD CULTURE ID PANEL (REFLEXED)
Acinetobacter baumannii: NOT DETECTED
CANDIDA ALBICANS: NOT DETECTED
CANDIDA GLABRATA: NOT DETECTED
CANDIDA PARAPSILOSIS: NOT DETECTED
CANDIDA TROPICALIS: NOT DETECTED
Candida krusei: NOT DETECTED
ENTEROBACTER CLOACAE COMPLEX: NOT DETECTED
ENTEROBACTERIACEAE SPECIES: NOT DETECTED
Enterococcus species: NOT DETECTED
Escherichia coli: NOT DETECTED
Haemophilus influenzae: NOT DETECTED
KLEBSIELLA OXYTOCA: NOT DETECTED
KLEBSIELLA PNEUMONIAE: NOT DETECTED
Listeria monocytogenes: NOT DETECTED
Methicillin resistance: NOT DETECTED
NEISSERIA MENINGITIDIS: NOT DETECTED
Proteus species: NOT DETECTED
Pseudomonas aeruginosa: NOT DETECTED
STREPTOCOCCUS PYOGENES: NOT DETECTED
STREPTOCOCCUS SPECIES: NOT DETECTED
Serratia marcescens: NOT DETECTED
Staphylococcus aureus (BCID): NOT DETECTED
Staphylococcus species: DETECTED — AB
Streptococcus agalactiae: NOT DETECTED
Streptococcus pneumoniae: NOT DETECTED

## 2015-12-06 LAB — CBC
HEMATOCRIT: 32.8 % — AB (ref 35.0–47.0)
Hemoglobin: 11.4 g/dL — ABNORMAL LOW (ref 12.0–16.0)
MCH: 34.8 pg — AB (ref 26.0–34.0)
MCHC: 34.9 g/dL (ref 32.0–36.0)
MCV: 99.7 fL (ref 80.0–100.0)
Platelets: 128 10*3/uL — ABNORMAL LOW (ref 150–440)
RBC: 3.29 MIL/uL — ABNORMAL LOW (ref 3.80–5.20)
RDW: 13.2 % (ref 11.5–14.5)
WBC: 6.1 10*3/uL (ref 3.6–11.0)

## 2015-12-06 LAB — URINE CULTURE: Culture: <10000 — AB

## 2015-12-06 LAB — BASIC METABOLIC PANEL
Anion gap: 7 (ref 5–15)
BUN: 9 mg/dL (ref 6–20)
CHLORIDE: 91 mmol/L — AB (ref 101–111)
CO2: 26 mmol/L (ref 22–32)
Calcium: 7.7 mg/dL — ABNORMAL LOW (ref 8.9–10.3)
Glucose, Bld: 88 mg/dL (ref 65–99)
POTASSIUM: 3.5 mmol/L (ref 3.5–5.1)
SODIUM: 124 mmol/L — AB (ref 135–145)

## 2015-12-06 LAB — EXPECTORATED SPUTUM ASSESSMENT W GRAM STAIN, RFLX TO RESP C

## 2015-12-06 LAB — EXPECTORATED SPUTUM ASSESSMENT W REFEX TO RESP CULTURE

## 2015-12-06 MED ORDER — AMLODIPINE BESYLATE 5 MG PO TABS
5.0000 mg | ORAL_TABLET | Freq: Every day | ORAL | Status: DC
  Administered 2015-12-06 – 2015-12-08 (×3): 5 mg via ORAL
  Filled 2015-12-06 (×3): qty 1

## 2015-12-06 MED ORDER — ORAL CARE MOUTH RINSE
15.0000 mL | Freq: Two times a day (BID) | OROMUCOSAL | Status: DC
  Administered 2015-12-06 – 2015-12-07 (×3): 15 mL via OROMUCOSAL

## 2015-12-06 MED ORDER — SODIUM CHLORIDE 0.9 % IV SOLN
INTRAVENOUS | Status: AC
  Administered 2015-12-06: 16:00:00 via INTRAVENOUS

## 2015-12-06 NOTE — Evaluation (Signed)
Clinical/Bedside Swallow Evaluation Patient Details  Name: Katrina Gonzalez MRN: [id number] Date of Birth: September 14, 1920  Today's Date: 12/06/2015 Time: SLP Start Time (ACUTE ONLY): 1130 SLP Stop Time (ACUTE ONLY): 1230 SLP Time Calculation (min) (ACUTE ONLY): 60 min  Past Medical History:  Past Medical History:  Diagnosis Date  . Coronary artery disease   . Hypertension   . Seizures (HCC)   . Stroke (HCC)   . Thyroid disease    Past Surgical History:  Past Surgical History:  Procedure Laterality Date  . ABDOMINAL HYSTERECTOMY    . FRACTURE SURGERY     HPI:      Assessment / Plan / Recommendation Clinical Impression  Pt appears at min increased risk for aspiration secondary to declined Cognitive status impacting her Oral phase of swallowing; suspected delayed pharyngeal swallow evidenced by the audible swallows w/ trials of Nectar liquids as well.     Aspiration Risk  Mild aspiration risk - Moderate aspiration risk    Diet Recommendation  Dyspahgia 1 w/ Nectar liquids via Spoon, Cup; strict aspiration precautions and feeding assistance  Medication Administration: Crushed with puree    Other  Recommendations Recommended Consults:  (Dietician) Oral Care Recommendations: Oral care BID;Staff/trained caregiver to provide oral care Other Recommendations: Order thickener from pharmacy;Prohibited food (jello, ice cream, thin soups);Remove water pitcher;Have oral suction available   Follow up Recommendations  (TBD)      Frequency and Duration min 3x week  2 weeks       Prognosis Prognosis for Safe Diet Advancement: Fair Barriers to Reach Goals: Cognitive deficits;Severity of deficits      Swallow Study   General Date of Onset: 12/05/15 Type of Study: Bedside Swallow Evaluation Previous Swallow Assessment: previous assessments indicated - pt had been given "thickened liquids" before per family Diet Prior to this Study: Dysphagia 1 (puree);Dysphagia 2 (chopped);Thin  liquids Temperature Spikes Noted: No (wbc 6.1) Respiratory Status: Nasal cannula (3 liters) History of Recent Intubation: No Behavior/Cognition: Confused;Cooperative;Impulsive;Lethargic/Drowsy;Requires cueing;Doesn't follow directions (declined Cognitive status) Oral Cavity Assessment:  (unable to fully assess d/t Cognitive status) Oral Care Completed by SLP: Recent completion by staff Oral Cavity - Dentition: Edentulous Vision:  (n/a) Self-Feeding Abilities: Total assist Patient Positioning: Upright in bed Baseline Vocal Quality:  (nonverbal) Volitional Cough: Cognitively unable to elicit Volitional Swallow: Unable to elicit    Oral/Motor/Sensory Function Overall Oral Motor/Sensory Function:  (unable to fully assess d/t Cognition; wfl w/ boluses)   Ice Chips Ice chips: Impaired Presentation: Spoon (x1 trial) Oral Phase Impairments: Poor awareness of bolus Other Comments: appeared to quickly swallow the ice chip   Thin Liquid Thin Liquid: Not tested    Nectar Thick Nectar Thick Liquid: Impaired Presentation: Spoon;Cup (fed; ~2-3 ozs total) Oral Phase Impairments: Poor awareness of bolus;Reduced labial seal;Reduced lingual movement/coordination (w/ cup) Oral phase functional implications:  (spillage intermittently) Pharyngeal Phase Impairments: Suspected delayed Swallow (no coughing or throat clearing) Other Comments: pt's Cognitive status impacted oral acceptance and control of boluses. This appears baseline for pt per family report.   Honey Thick Honey Thick Liquid: Not tested   Puree Puree: Impaired Presentation: Spoon (fed; 8 trials) Oral Phase Impairments: Reduced labial seal;Reduced lingual movement/coordination;Poor awareness of bolus Oral Phase Functional Implications: Prolonged oral transit Pharyngeal Phase Impairments:  (none) Other Comments: similar to the trials of Nectar liquids in presentation of the oral phase impacted by Cognitive status   Solid   GO   Solid: Not  tested        Natalia LeatherwoodKatherine  Claudette LawsWatson, MS, CCC-SLP Amorina Doerr 12/06/2015,5:48 PM

## 2015-12-06 NOTE — Progress Notes (Signed)
Patient's daughter notified RN that patient has seizures. RN texted paged MD, no response yet. Will pass along in report.  Harvie HeckMelanie Gusta Marksberry, RN

## 2015-12-06 NOTE — Progress Notes (Signed)
[employer] - [place] at [facility]   PATIENT NAME: Katrina Gonzalez    MR#:  [id number]  DATE OF BIRTH:  Mar 17, 1920  SUBJECTIVE:  CHIEF COMPLAINT:   Chief Complaint  Patient presents with  . Cough  . Nasal Congestion  . Shortness of Breath   - Patient with dementia at baseline, wheelchair-bound presents with possible aspiration pneumonia. -Still has increased coarse breath sounds, unable to cough the phlegm out  REVIEW OF SYSTEMS:  Review of Systems  Unable to perform ROS: Dementia    DRUG ALLERGIES:  No Known Allergies  VITALS:  Blood pressure (!) 158/74, pulse 85, temperature 97.4 F (36.3 C), temperature source Axillary, resp. rate 16, height 5\' 4"  (1.626 m), weight 63.5 kg (140 lb), SpO2 100 %.  PHYSICAL EXAMINATION:  Physical Exam  GENERAL:  82 y.o.-year-old patient sitting in the bed with no acute distress. Uncomfortable appearing EYES: Pupils equal, round, reactive to light and accommodation. No scleral icterus. Extraocular muscles intact.  HEENT: Head atraumatic, normocephalic. Oropharynx and nasopharynx clear.  NECK:  Supple, no jugular venous distention. No thyroid enlargement, no tenderness.  LUNGS:coarse breath sounds bilaterally, no wheeze or rales. Significant rhonchi present.. No use of accessory muscles of respiration.  CARDIOVASCULAR: S1, S2 normal. No  rubs, or gallops. 3/6 systolic murmur present. ABDOMEN: Soft, nontender, nondistended. Bowel sounds present. No organomegaly or mass.  EXTREMITIES: No pedal edema, cyanosis, or clubbing.  NEUROLOGIC: not following commands. Contracted extremities PSYCHIATRIC: The patient is alert but not communicative.  SKIN: No obvious rash, lesion, or ulcer.    LABORATORY PANEL:   CBC  Recent Labs Lab 12/06/15 0414  WBC 6.1  HGB 11.4*  HCT 32.8*  PLT 128*   ------------------------------------------------------------------------------------------------------------------  Chemistries    Recent Labs Lab 12/05/15 1036 12/06/15 0414  NA 122* 124*  K 4.5 3.5  CL 88* 91*  CO2 26 26  GLUCOSE 91 88  BUN 12 9  CREATININE 0.37* <0.30*  CALCIUM 7.8* 7.7*  MG 1.1*  --    ------------------------------------------------------------------------------------------------------------------  Cardiac Enzymes  Recent Labs Lab 12/05/15 1036  TROPONINI <0.03   ------------------------------------------------------------------------------------------------------------------  RADIOLOGY:  Dg Chest Portable 1 View  Result Date: 12/05/2015 CLINICAL DATA:  Short of breath EXAM: PORTABLE CHEST 1 VIEW COMPARISON:  10/07/2014 FINDINGS: Cardiac enlargement. Negative for heart failure. Bibasilar atelectasis/ infiltrate has developed in the interval. Possible small pleural effusions. Advanced degenerative change in both shoulder joints IMPRESSION: Bibasilar atelectasis/ infiltrate and small bilateral effusions. Electronically Signed   By: Marlan Palauharles  Clark M.D.   On: 12/05/2015 11:18    EKG:   Orders placed or performed during the hospital encounter of 12/05/15  . ED EKG  . ED EKG  . EKG 12-Lead  . EKG 12-Lead    ASSESSMENT AND PLAN:   82 y/o F with PMH of CAD, HTN, seizure disorder, dementia presents from home secondary to resp distress and hypoxia.  #1 acute hypoxic respiratory failure-secondary to aspiration pneumonia. -Appreciate speech therapy consult. Diet adjusted -Unable to cough or phlegm, recommend chest physiotherapy. -Cough medications and treatments. -Continue Unasyn for now. -Follow up cultures  #2 hyponatremia-secondary to hypovolemic hyponatremia. -Gentle hydration and monitor  #3 seizure disorder-continue Depakote. No active seizures.  #4 hypertension-on lisinopril and Norvasc. Increase doses if needed.  #5 DVT prophylaxis-on Lovenox  Her daughter and caregiver uptated at bedside   All the records are reviewed and case discussed with Care  Management/Social Workerr. Management plans discussed with the patient, family and they are in  agreement.  CODE STATUS: DO NOT RESUSCITATE  TOTAL TIME TAKING CARE OF THIS PATIENT: 37 minutes.   POSSIBLE D/C IN 2 DAYS, DEPENDING ON CLINICAL CONDITION.   Enid BaasKALISETTI,Cathleen Yagi M.D on 12/06/2015 at 2:45 PM  Between 7am to 6pm - Pager - [phone]  After 6pm go to [email or web address] - Social research officer, governmentpassword EPAS [facility]  [facility]  Office  [phone]  CC: Primary care physician; Inc [facility]

## 2015-12-06 NOTE — Progress Notes (Signed)
On assessment patient disoriented X4. Patient is non verbal at this time. Patient lung sounds are rhronchus and she is having a nonproductive cough. Patient unable to cough up anything at this time. Patient is on a chronic 3L O2, RN added humidification. Patient awaiting speech therapist so patient can eat. RN will cush meds and feed patient after speech therapy eval. Daughters at the bedside. Patient is incontinent of bowel and urine. Patient calm and cooperative at this time.   Harvie HeckMelanie Graylen [place], RN

## 2015-12-06 NOTE — Progress Notes (Signed)
Patient not tolerating cpt

## 2015-12-06 NOTE — Progress Notes (Signed)
RN fed patient 50% of food and a honey thick apple juice. Rn crushed meds in applesauce and administered. Patient did well with eating. Showed visible signs of readiness to eat. RN made sure that patient mouth was cleardd when she was done eating.  Patient head left elevated 30 mins after eating.   Harvie HeckMelanie Shivali Quackenbush, RN

## 2015-12-07 ENCOUNTER — Inpatient Hospital Stay: Payer: Medicare PPO

## 2015-12-07 LAB — CBC
HEMATOCRIT: 31.9 % — AB (ref 35.0–47.0)
HEMOGLOBIN: 11.1 g/dL — AB (ref 12.0–16.0)
MCH: 34.6 pg — AB (ref 26.0–34.0)
MCHC: 34.9 g/dL (ref 32.0–36.0)
MCV: 99.2 fL (ref 80.0–100.0)
PLATELETS: 133 10*3/uL — AB (ref 150–440)
RBC: 3.22 MIL/uL — AB (ref 3.80–5.20)
RDW: 13.3 % (ref 11.5–14.5)
WBC: 6.3 10*3/uL (ref 3.6–11.0)

## 2015-12-07 LAB — BASIC METABOLIC PANEL
ANION GAP: 7 (ref 5–15)
BUN: 13 mg/dL (ref 6–20)
CHLORIDE: 94 mmol/L — AB (ref 101–111)
CO2: 27 mmol/L (ref 22–32)
Calcium: 7.4 mg/dL — ABNORMAL LOW (ref 8.9–10.3)
Creatinine, Ser: 0.43 mg/dL — ABNORMAL LOW (ref 0.44–1.00)
GFR calc Af Amer: >60 mL/min (ref >60)
GLUCOSE: 93 mg/dL (ref 65–99)
POTASSIUM: 3.3 mmol/L — AB (ref 3.5–5.1)
Sodium: 128 mmol/L — ABNORMAL LOW (ref 135–145)

## 2015-12-07 MED ORDER — POTASSIUM CHLORIDE 20 MEQ PO PACK
40.0000 meq | PACK | Freq: Once | ORAL | Status: AC
  Administered 2015-12-07: 40 meq via ORAL
  Filled 2015-12-07: qty 2

## 2015-12-07 MED ORDER — ENOXAPARIN SODIUM 40 MG/0.4ML ~~LOC~~ SOLN
40.0000 mg | SUBCUTANEOUS | Status: DC
  Administered 2015-12-07 – 2015-12-08 (×2): 40 mg via SUBCUTANEOUS
  Filled 2015-12-07 (×2): qty 0.4

## 2015-12-07 NOTE — Progress Notes (Signed)
Anticoagulation monitoring(Lovenox):  82yo  female ordered Lovenox 30 mg Q24h  Filed Weights   12/05/15 1040  Weight: 140 lb (63.5 kg)   BMI 24.1   Lab Results  Component Value Date   CREATININE 0.43 (L) 12/07/2015   CREATININE <0.30 (L) 12/06/2015   CREATININE 0.37 (L) 12/05/2015   Estimated Creatinine Clearance: 37.9 mL/min (by C-G formula based on SCr of 0.43 mg/dL (L)). Hemoglobin & Hematocrit     Component Value Date/Time   HGB 11.1 (L) 12/07/2015 0449   HGB 8.3 (L) 02/05/2013 0605   HCT 31.9 (L) 12/07/2015 0449   HCT 24.9 (L) 02/05/2013 [id number]     Per Protocol for Patient with estCrcl >30 ml/min and BMI < 40, will transition to Lovenox 40 mg Q24h.

## 2015-12-07 NOTE — Progress Notes (Signed)
[employer] - [place] at [facility]   PATIENT NAME: Katrina Gonzalez    MR#:  [id number]  DATE OF BIRTH:  Jan 23, 1920  SUBJECTIVE:  CHIEF COMPLAINT:   Chief Complaint  Patient presents with  . Cough  . Nasal Congestion  . Shortness of Breath   - Patient with dementia at baseline, wheelchair-bound presents with possible aspiration pneumonia. -Still has increased coarse breath sounds, Remains on 3 L oxygen, decreased to 2 L today  REVIEW OF SYSTEMS:  Review of Systems  Unable to perform ROS: Dementia    DRUG ALLERGIES:  No Known Allergies  VITALS:  Blood pressure (!) 148/84, pulse 83, temperature 98.1 F (36.7 C), temperature source Axillary, resp. rate 18, height 5\' 4"  (1.626 m), weight 63.5 kg (140 lb), SpO2 100 %.  PHYSICAL EXAMINATION:  Physical Exam  GENERAL:  82 y.o.-year-old patient sitting in the bed with no acute distress. Opening eyes to tactile stimulation. EYES: Pupils equal, round, reactive to light and accommodation. No scleral icterus. Extraocular muscles intact.  HEENT: Head atraumatic, normocephalic. Oropharynx and nasopharynx clear.  NECK:  Supple, no jugular venous distention. No thyroid enlargement, no tenderness.  LUNGS:coarse breath sounds bilaterally, no wheeze or rales. Significant rhonchi present.. No use of accessory muscles of respiration.  CARDIOVASCULAR: S1, S2 normal. No  rubs, or gallops. 3/6 systolic murmur present. ABDOMEN: Soft, nontender, nondistended. Bowel sounds present. No organomegaly or mass.  EXTREMITIES: No pedal edema, cyanosis, or clubbing.  NEUROLOGIC: not following commands. Contracted extremities PSYCHIATRIC: The patient is alert but not communicative. At baseline she is nonverbal mostly. SKIN: No obvious rash, lesion, or ulcer.    LABORATORY PANEL:   CBC  Recent Labs Lab 12/07/15 0449  WBC 6.3  HGB 11.1*  HCT 31.9*  PLT 133*    ------------------------------------------------------------------------------------------------------------------  Chemistries   Recent Labs Lab 12/05/15 1036  12/07/15 0449  NA 122*  < > 128*  K 4.5  < > 3.3*  CL 88*  < > 94*  CO2 26  < > 27  GLUCOSE 91  < > 93  BUN 12  < > 13  CREATININE 0.37*  < > 0.43*  CALCIUM 7.8*  < > 7.4*  MG 1.1*  --   --   < > = values in this interval not displayed. ------------------------------------------------------------------------------------------------------------------  Cardiac Enzymes  Recent Labs Lab 12/05/15 1036  TROPONINI <0.03   ------------------------------------------------------------------------------------------------------------------  RADIOLOGY:  Dg Chest Portable 1 View  Result Date: 12/05/2015 CLINICAL DATA:  Short of breath EXAM: PORTABLE CHEST 1 VIEW COMPARISON:  10/07/2014 FINDINGS: Cardiac enlargement. Negative for heart failure. Bibasilar atelectasis/ infiltrate has developed in the interval. Possible small pleural effusions. Advanced degenerative change in both shoulder joints IMPRESSION: Bibasilar atelectasis/ infiltrate and small bilateral effusions. Electronically Signed   By: Marlan Palauharles  Clark M.D.   On: 12/05/2015 11:18    EKG:   Orders placed or performed during the hospital encounter of 12/05/15  . ED EKG  . ED EKG  . EKG 12-Lead  . EKG 12-Lead    ASSESSMENT AND PLAN:   82 y/o F with PMH of CAD, HTN, seizure disorder, dementia presents from home secondary to resp distress and hypoxia.  #1 acute hypoxic respiratory failure-secondary to aspiration pneumonia. -Appreciate speech therapy consult. Diet adjusted -Unable to cough or phlegm, recommended chest physiotherapy if patient is able to tolerate. -Cough medications and treatments. -Continue Unasyn for now. -Follow up chest x-ray today -Wean oxygen as tolerated. Currently on 3 L  #2 hyponatremia-secondary  to hypovolemic hyponatremia. -Gentle  hydration and monitor -Improving slowly  #3 seizure disorder-continue Depakote. No active seizures.  #4 hypertension-on lisinopril and Norvasc. Increase doses if needed. Blood pressure is better today  #5 DVT prophylaxis-on Lovenox  #6 hypokalemia-being replaced  Her daughter and son uptated at bedside   All the records are reviewed and case discussed with Care Management/Social Workerr. Management plans discussed with the patient, family and they are in agreement.  CODE STATUS: DO NOT RESUSCITATE  TOTAL TIME TAKING CARE OF THIS PATIENT: 36 minutes.   POSSIBLE D/C IN 2 DAYS, DEPENDING ON CLINICAL CONDITION.   Enid BaasKALISETTI,Alanmichael Barmore M.D on 12/07/2015 at 10:35 AM  Between 7am to 6pm - Pager - [phone]  After 6pm go to [email or web address] - Social research officer, governmentpassword EPAS [facility]  [facility] [place] [facility]  Office  [phone]  CC: Primary care physician; Inc [facility]

## 2015-12-08 DIAGNOSIS — J690 Pneumonitis due to inhalation of food and vomit: Principal | ICD-10-CM

## 2015-12-08 DIAGNOSIS — Z7189 Other specified counseling: Secondary | ICD-10-CM

## 2015-12-08 DIAGNOSIS — Z515 Encounter for palliative care: Secondary | ICD-10-CM

## 2015-12-08 LAB — BASIC METABOLIC PANEL
Anion gap: 4 — ABNORMAL LOW (ref 5–15)
BUN: 14 mg/dL (ref 6–20)
CALCIUM: 7.4 mg/dL — AB (ref 8.9–10.3)
CO2: 28 mmol/L (ref 22–32)
CREATININE: 0.44 mg/dL (ref 0.44–1.00)
Chloride: 97 mmol/L — ABNORMAL LOW (ref 101–111)
GFR calc non Af Amer: >60 mL/min (ref >60)
GLUCOSE: 90 mg/dL (ref 65–99)
Potassium: 3.9 mmol/L (ref 3.5–5.1)
Sodium: 129 mmol/L — ABNORMAL LOW (ref 135–145)

## 2015-12-08 LAB — CULTURE, RESPIRATORY W GRAM STAIN: Culture: NORMAL

## 2015-12-08 LAB — CULTURE, BLOOD (ROUTINE X 2)

## 2015-12-08 LAB — CULTURE, RESPIRATORY

## 2015-12-08 MED ORDER — ALBUTEROL SULFATE (2.5 MG/3ML) 0.083% IN NEBU: 2.5000 mg | INHALATION_SOLUTION | RESPIRATORY_TRACT | Status: DC | PRN

## 2015-12-08 MED ORDER — AMOXICILLIN-POT CLAVULANATE 875-125 MG PO TABS
1.0000 | ORAL_TABLET | Freq: Two times a day (BID) | ORAL | Status: DC
  Administered 2015-12-08 – 2015-12-09 (×3): 1 via ORAL
  Filled 2015-12-08 (×4): qty 1

## 2015-12-08 MED ORDER — ALBUTEROL SULFATE (2.5 MG/3ML) 0.083% IN NEBU
2.5000 mg | INHALATION_SOLUTION | Freq: Four times a day (QID) | RESPIRATORY_TRACT | Status: DC
  Administered 2015-12-08 – 2015-12-09 (×2): 2.5 mg via RESPIRATORY_TRACT
  Filled 2015-12-08 (×2): qty 3

## 2015-12-08 NOTE — Progress Notes (Signed)
Initial Nutrition Assessment  DOCUMENTATION CODES:   Not applicable  INTERVENTION:  1. NDD1-Nectar Thick per SLP 2. Monitor and Encourage PO intake  NUTRITION DIAGNOSIS:   Swallowing difficulty related to acute illness as evidenced by per patient/family report, other (see comment) (On NDD1-Nectar Thick now).  GOAL:   Patient will meet greater than or equal to 90% of their needs  MONITOR:   PO intake, I & O's, Labs, Weight trends, Supplement acceptance  REASON FOR ASSESSMENT:   Low Braden    ASSESSMENT:   Zailah Toni ArthursFuller  is a 82 y.o. female with a known history of Coronary artery disease, hypertension, seizure disorder, stroke, thyroid disease, who presents to the hospital with complaints of rattling in the chest, cough, weakness.  Spoke with Ms. Ragen's family at bedside.  Patient was lethargic during my visit - has dementia. Family reports good PO intake and appetite PTA. Denies weight loss. Patient had Oatmeal, Scrambled Eggs, Apple Sauce and Orange Juice this morning they report she consumed 100% of. Nutrition-Focused physical exam completed. Findings are moderate fat depletion, moderate muscle depletion, and no edema.  Seems ok from a nutrition perspective now that she is on NDD1 - may need education discharging. Palliative consult pending  Labs and medications reviewed: CBG 129 Senokot-S  Diet Order:  DIET - DYS 1 Room service appropriate? No; Fluid consistency: Nectar Thick  Skin:  Reviewed, no issues  Last BM:  11/29  Height:   Ht Readings from Last 1 Encounters:  12/05/15 5\' 4"  (1.626 m)    Weight:   Wt Readings from Last 1 Encounters:  12/05/15 140 lb (63.5 kg)    Ideal Body Weight:  54.54 kg  BMI:  Body mass index is 24.03 kg/m.  Estimated Nutritional Needs:   Kcal:  1400-1700 calories  Protein:  63-76 gm  Fluid:  >/= 1.4L  EDUCATION NEEDS:   No education needs identified at this time  Dionne AnoWilliam M. Natiya Seelinger, MS, RD LDN Inpatient  Clinical Dietitian Pager [phone]

## 2015-12-08 NOTE — Progress Notes (Signed)
[employer] - [place] at [facility]   PATIENT NAME: Katrina Gonzalez    MR#:  [id number]  DATE OF BIRTH:  Dec 08, 1920  SUBJECTIVE:  CHIEF COMPLAINT:   Chief Complaint  Patient presents with  . Cough  . Nasal Congestion  . Shortness of Breath   - Patient with dementia at baseline, wheelchair-bound presents with possible aspiration pneumonia. -Still sounds congested, mostly upper airway, unable to cough the phlegm out  REVIEW OF SYSTEMS:  Review of Systems  Unable to perform ROS: Dementia    DRUG ALLERGIES:  No Known Allergies  VITALS:  Blood pressure 139/61, pulse 84, temperature 98 F (36.7 C), temperature source Oral, resp. rate 18, height 5\' 4"  (1.626 m), weight 63.5 kg (140 lb), SpO2 100 %.  PHYSICAL EXAMINATION:  Physical Exam  GENERAL:  82 y.o.-year-old patient sitting in the bed with no acute distress. Opening eyes to tactile stimulation. EYES: Pupils equal, round, reactive to light and accommodation. No scleral icterus. Extraocular muscles intact.  HEENT: Head atraumatic, normocephalic. Oropharynx and nasopharynx clear.  NECK:  Supple, no jugular venous distention. No thyroid enlargement, no tenderness.  LUNGS: clear breath sounds bilaterally at the bases, more congested in upper airway, no wheeze or rales. Significant rhonchi present.. No use of accessory muscles of respiration.  CARDIOVASCULAR: S1, S2 normal. No  rubs, or gallops. 3/6 systolic murmur present. ABDOMEN: Soft, nontender, nondistended. Bowel sounds present. No organomegaly or mass.  EXTREMITIES: No pedal edema, cyanosis, or clubbing.  NEUROLOGIC: not following commands. Contracted extremities PSYCHIATRIC: The patient is alert but not communicative. At baseline she is nonverbal mostly. SKIN: No obvious rash, lesion, or ulcer.    LABORATORY PANEL:   CBC  Recent Labs Lab 12/07/15 0449  WBC 6.3  HGB 11.1*  HCT 31.9*  PLT 133*    ------------------------------------------------------------------------------------------------------------------  Chemistries   Recent Labs Lab 12/05/15 1036  12/08/15 0354  NA 122*  < > 129*  K 4.5  < > 3.9  CL 88*  < > 97*  CO2 26  < > 28  GLUCOSE 91  < > 90  BUN 12  < > 14  CREATININE 0.37*  < > 0.44  CALCIUM 7.8*  < > 7.4*  MG 1.1*  --   --   < > = values in this interval not displayed. ------------------------------------------------------------------------------------------------------------------  Cardiac Enzymes  Recent Labs Lab 12/05/15 1036  TROPONINI <0.03   ------------------------------------------------------------------------------------------------------------------  RADIOLOGY:  Dg Chest 2 View  Result Date: 12/07/2015 CLINICAL DATA:  Worsening cough. EXAM: CHEST  2 VIEW COMPARISON:  December 05, 2015 FINDINGS: Small effusions and underlying opacities, left greater than right. The colon is interposed beneath the right hemidiaphragm, unchanged. Cardiomediastinal silhouette is stable. No other interval changes. Chronic changes in the shoulders, left greater than right. IMPRESSION: No interval change in small effusions and underlying opacities. Electronically Signed   By: Gerome Samavid  Williams III M.D   On: 12/07/2015 12:37    EKG:   Orders placed or performed during the hospital encounter of 12/05/15  . ED EKG  . ED EKG  . EKG 12-Lead  . EKG 12-Lead    ASSESSMENT AND PLAN:   82 y/o F with PMH of CAD, HTN, seizure disorder, dementia presents from home secondary to resp distress and hypoxia.  #1 acute hypoxic respiratory failure-secondary to aspiration pneumonia. -Appreciate speech therapy consult. Diet adjusted -Unable to cough or phlegm, recommended chest physiotherapy if patient is able to tolerate. -Cough medications and treatments. -on Unasyn -  change to augmentin. -Follow up chest x-ray with no changes -Wean oxygen as tolerated. Currently on 2  L  #2 hyponatremia-secondary to hypovolemic hyponatremia. -improved with fluids, discontinue fluids today  #3 seizure disorder-continue Depakote. No active seizures.  #4 hypertension-on lisinopril and Norvasc. Adjust doses if needed. Blood pressure is better now  #5 DVT prophylaxis-on Lovenox  #6 hypokalemia-replaced  Her daughter and son uptated at bedside Home health at discharge. Anticipate discharge tomorrow   All the records are reviewed and case discussed with Care Management/Social Workerr. Management plans discussed with the patient, family and they are in agreement.  CODE STATUS: DO NOT RESUSCITATE  TOTAL TIME TAKING CARE OF THIS PATIENT: 36 minutes.   POSSIBLE D/C TOMORROW, DEPENDING ON CLINICAL CONDITION.   Enid BaasKALISETTI,Daveion Robar M.D on 12/08/2015 at 12:24 PM  Between 7am to 6pm - Pager - [phone]  After 6pm go to [email or web address] - Social research officer, governmentpassword EPAS [facility]  [facility] [place] [facility]  Office  [phone]  CC: Primary care physician; Inc [facility]

## 2015-12-08 NOTE — Progress Notes (Signed)
Shift assessment completed at 0830, family memebes at bedside and pt ate approx 75% on breakfast tray, was fed by this Clinical research associatewriter and family. Pt resting with her ryrs open, does not speak, is drowsy, alert to self only. [facility], lungs are clear but pt has gurgling to her throat, does not follow command to cough, is able to swallow. Hr is regular, abdomen is soft, bs heard.ppp, no edema noted. Piv#20 intact to bilat forearms with kvo infusing  To R arm. Since admission, pt has been ffed lunch, Dr. Nemiah CommanderKalisetti has rounded on tp and spoken with family twice, pt has been repositioned and changed, and family is aware of plan of care regarding discharge tomorrow.

## 2015-12-08 NOTE — Care Management Note (Signed)
Case Management Note  Patient Details  Name: Consuelo PandyMittie WS Wichman MRN: [id number] Date of Birth: 09/08/1920  Subjective/Objective:  Admitted with aspiration PNA. Presents from home where she lives with her daughter, Ceasar LundJean Stephens [phone]). Spoke with Carney BernJean by phone. Discussed discharge plan. It is anticipated that patient will discharge tomorrow. Will need SN and PT per attending. Daughter prefers to use [facility]. She has used them for her mother in the past. No DME needs. Patient is bed bound in a hospital bed at baseline per daughter.                  Action/Plan: [facility] for SN and PT.   Expected Discharge Date:     12/09/2015             Expected Discharge Plan:  Home w Home Health Services  In-House Referral:     Discharge planning Services  CM Consult  Post Acute Care Choice:  Home Health Choice offered to:  Adult Children  DME Arranged:    DME Agency:     HH Arranged:  RN HH Agency:  [facility]  Status of Service:  In process, will continue to follow  If discussed at Long Length of Stay Meetings, dates discussed:    Additional Comments:  Marily MemosLisa M Whitley Patchen, RN 12/08/2015, 12:30 PM

## 2015-12-08 NOTE — Consult Note (Signed)
Consultation Note Date: 12/08/2015   Patient Name: Katrina Gonzalez  DOB: [birth year]  MRN: [id number]  Age / Sex: 82 y.o., female  PCP: [facility] Referring Physician: Gladstone Lighter, MD  Reason for Consultation: Establishing goals of care  HPI/Patient Profile: 82 y.o. female  with past medical history of dementia, seizures, HTN, CAD, CVA admitted on 12/05/2015 with chest congestion, cough, weakness. Workup revealed aspiration pnuemonia and dysphagia. Palliative medicine consulted for [place].   Clinical Assessment and Goals of Care: Evaluated patient and met bedside with her daughter, Katrina Gonzalez. Patient has been living at the home of her other daughter, Katrina Gonzalez. Patient is bedbound ever since she broke her hip several years ago. She was under the care of hospice in 2016 however, was discharged. Katrina Gonzalez reports that patient eats well, and gets her joy in life from eating, sleeping, and listening to music with Katrina Gonzalez. Patient is unable to complete any of her own ADL's. She talks at baseline but her verbalizations are unintelligible. Today patient is nonverbal and does not follow commands. She does open her eyes to voices, but is otherwise non responsive. Katrina Gonzalez contacted Katrina Gonzalez (who is patient's primary caregiver) while I wasw in the room and we arranged a [place] meeting for tomorrow, 12/4 at 8am.  Primary Decision Maker NEXT OF KIN- daughters, Katrina Gonzalez and Katrina Gonzalez (also goes by Arbie Cookey)    SUMMARY OF RECOMMENDATIONS -continue current level of care -[place] meeting planned for tomorrow, I anticipate offering hospice services at home given patients dementia stage FAST 7D and now with aspiration pneumonia  Code Status/Advance Care Planning:  DNR  Palliative Prophylaxis:   Aspiration and Delirium Protocol  Additional Recommendations (Limitations, Scope, Preferences):  Full Scope Treatment  DNR  Psycho-social/Spiritual:   Desire for further Chaplaincy support:No  Additional Recommendations: Caregiving  Support/Resources and Education on Hospice  Prognosis:    < 6 months d/t advanced dementia, now with aspiration pneumonia  Discharge Planning: To Be Determined anticipate home with hospice  Primary Diagnoses: Present on Admission: . Respiratory failure with hypoxia ([place])   I have reviewed the medical record, interviewed the patient and family, and examined the patient. The following aspects are pertinent.  Past Medical History:  Diagnosis Date  . Coronary artery disease   . Hypertension   . Seizures ([place])   . Stroke ([place])   . Thyroid disease    Social History   Social History  . Marital status: Married    Spouse name: N/A  . Number of children: N/A  . Years of education: N/A   Social History Main Topics  . Smoking status: Never Smoker  . Smokeless tobacco: Never Used  . Alcohol use No  . Drug use: Unknown  . Sexual activity: Not Asked   Other Topics Concern  . None   Social History Narrative  . None   Family History  Problem Relation Age of Onset  . Heart disease Father   . Hypertension Father    Scheduled Meds: . albuterol  2.5 mg Nebulization  Q4H  . amLODipine  5 mg Oral Daily  . amoxicillin-clavulanate  1 tablet Oral Q12H  . divalproex  125 mg Oral Q8H  . enoxaparin (LOVENOX) injection  40 mg Subcutaneous Q24H  . levothyroxine  100 mcg Oral QAC breakfast  . lisinopril  10 mg Oral Daily  . mouth rinse  15 mL Mouth Rinse BID  . pravastatin  20 mg Oral q1800  . senna-docusate  1 tablet Oral QHS   Continuous Infusions: PRN Meds:.acetaminophen **OR** acetaminophen, ondansetron **OR** ondansetron (ZOFRAN) IV Medications Prior to Admission:  Prior to Admission medications   Medication Sig Start Date End Date Taking? Authorizing Provider  divalproex (DEPAKOTE) 125 MG DR tablet Take 125 mg by mouth 2 (two) times daily.   Yes Historical  Provider, MD  levothyroxine (SYNTHROID, LEVOTHROID) 100 MCG tablet Take 100 mcg by mouth daily before breakfast.   Yes Historical Provider, MD  lovastatin (MEVACOR) 20 MG tablet Take 20 mg by mouth at bedtime.   Yes Historical Provider, MD  Potassium Chloride CR (MICRO-K) 8 MEQ CPCR capsule CR Take 8 mEq by mouth daily.   Yes Historical Provider, MD  quinapril (ACCUPRIL) 10 MG tablet Take 10 mg by mouth daily.   Yes Historical Provider, MD  sennosides-docusate sodium (SENOKOT-S) 8.6-50 MG tablet Take 1 tablet by mouth at bedtime.   Yes Historical Provider, MD  cephALEXin (KEFLEX) 500 MG capsule Take 1 capsule (500 mg total) by mouth 2 (two) times daily. Patient not taking: Reported on 12/05/2015 10/08/14   Fritzi Mandes, MD  divalproex (DEPAKOTE SPRINKLE) 125 MG capsule Take 1 capsule (125 mg total) by mouth every 8 (eight) hours. Patient not taking: Reported on 12/05/2015 10/08/14   Fritzi Mandes, MD  LORazepam (ATIVAN) 0.5 MG tablet Take 0.5 mg by mouth every 2 (two) hours.     Historical Provider, MD   No Known Allergies Review of Systems  Unable to perform ROS: Dementia    Physical Exam  Constitutional: She appears well-developed. No distress.  Cardiovascular: Normal rate and regular rhythm.   Pulmonary/Chest: Effort normal. She has rales.  Neurological:  Nonverbal, does not follow commands, somnolent, opens eyes to voices   Skin: Skin is warm and dry.    Vital Signs: BP 139/61 (BP Location: Right Arm)   Pulse 84   Temp 98 F (36.7 C) (Oral)   Resp 18   Ht _0  (1.626 m)   Wt 63.5 kg (140 lb)   SpO2 100%   BMI 24.03 kg/m  Pain Assessment: Faces POSS *See Group Information*: S-Acceptable,Sleep, easy to arouse Pain Score: Asleep   SpO2: SpO2: 100 % O2 Device:SpO2: 100 % O2 Flow Rate: .O2 Flow Rate (L/min): 3 L/min  IO: Intake/output summary:  Intake/Output Summary (Last 24 hours) at 12/08/15 1411 Last data filed at 12/08/15 1343  Gross per 24 hour  Intake              620  ml  Output                0 ml  Net              620 ml    LBM: Last BM Date: 12/03/15 Baseline Weight: Weight: 63.5 kg (140 lb) Most recent weight: Weight: 63.5 kg (140 lb)     Palliative Assessment/Data: PPS: 10%   Flowsheet Rows   Flowsheet Row Most Recent Value  Intake Tab  Referral Department  Hospitalist  Unit at Time of Referral  Orthopedic Unit  Palliative Care Primary Diagnosis  Cardiac  Date Notified  12/05/15  Palliative Care Type  New Palliative care  Reason for referral  Clarify Goals of Care  Date of Admission  12/05/15  # of days IP prior to Palliative referral  0  Clinical Assessment  Psychosocial & Spiritual Assessment  Palliative Care Outcomes      Thank you for this consult. Palliative medicine will continue to follow and assist as needed.   Time In: 1330 Time Out: 1420 Time Total: 50 minutes Greater than 50%  of this time was spent counseling and coordinating care related to the above assessment and plan.  Signed by: Mariana Kaufman, AGNP-C Palliative Medicine    Please contact Palliative Medicine Team phone at [phone] for questions and concerns.  For individual provider: See Shea Evans

## 2015-12-09 DIAGNOSIS — R1310 Dysphagia, unspecified: Secondary | ICD-10-CM

## 2015-12-09 DIAGNOSIS — Z515 Encounter for palliative care: Secondary | ICD-10-CM

## 2015-12-09 MED ORDER — GLYCOPYRROLATE 0.2 MG/ML IJ SOLN
0.2000 mg | INTRAMUSCULAR | Status: DC | PRN
  Filled 2015-12-09: qty 1

## 2015-12-09 MED ORDER — MORPHINE SULFATE (CONCENTRATE) 10 MG/0.5ML PO SOLN: 5.0000 mg | ORAL | Status: DC | PRN

## 2015-12-09 MED ORDER — HALOPERIDOL 0.5 MG PO TABS: 0.5000 mg | ORAL_TABLET | ORAL | Status: DC | PRN

## 2015-12-09 MED ORDER — HALOPERIDOL LACTATE 2 MG/ML PO CONC
0.5000 mg | ORAL | Status: DC | PRN
  Filled 2015-12-09: qty 0.3

## 2015-12-09 MED ORDER — LORAZEPAM 1 MG PO TABS: 1.0000 mg | ORAL_TABLET | ORAL | Status: DC | PRN

## 2015-12-09 MED ORDER — AMOXICILLIN-POT CLAVULANATE 875-125 MG PO TABS: 1.0000 | ORAL_TABLET | Freq: Two times a day (BID) | ORAL | 0 refills | Status: AC

## 2015-12-09 MED ORDER — GLYCOPYRROLATE 1 MG PO TABS: 1.0000 mg | ORAL_TABLET | ORAL | Status: DC | PRN

## 2015-12-09 MED ORDER — LORAZEPAM 1 MG PO TABS: 0.5000 mg | ORAL_TABLET | ORAL | 0 refills | Status: AC | PRN

## 2015-12-09 MED ORDER — TRAZODONE HCL 50 MG PO TABS: 25.0000 mg | ORAL_TABLET | Freq: Every evening | ORAL | Status: DC | PRN

## 2015-12-09 MED ORDER — HALOPERIDOL LACTATE 5 MG/ML IJ SOLN: 0.5000 mg | INTRAMUSCULAR | Status: DC | PRN

## 2015-12-09 MED ORDER — SODIUM CHLORIDE 0.9% FLUSH: 3.0000 mL | INTRAVENOUS | Status: DC | PRN

## 2015-12-09 MED ORDER — GLYCOPYRROLATE 1 MG PO TABS: 1.0000 mg | ORAL_TABLET | ORAL | 0 refills | Status: AC | PRN

## 2015-12-09 MED ORDER — SODIUM CHLORIDE 0.9 % IV SOLN: 250.0000 mL | INTRAVENOUS | Status: DC | PRN

## 2015-12-09 MED ORDER — LORAZEPAM 2 MG/ML IJ SOLN: 1.0000 mg | INTRAMUSCULAR | Status: DC | PRN

## 2015-12-09 MED ORDER — MORPHINE SULFATE (CONCENTRATE) 10 MG/0.5ML PO SOLN: 5.0000 mg | ORAL | 0 refills | Status: AC | PRN

## 2015-12-09 MED ORDER — ALBUTEROL SULFATE (2.5 MG/3ML) 0.083% IN NEBU: 2.5000 mg | INHALATION_SOLUTION | Freq: Three times a day (TID) | RESPIRATORY_TRACT | Status: DC

## 2015-12-09 MED ORDER — SODIUM CHLORIDE 0.9% FLUSH: 3.0000 mL | Freq: Two times a day (BID) | INTRAVENOUS | Status: DC

## 2015-12-09 MED ORDER — LORAZEPAM 2 MG/ML PO CONC
1.0000 mg | ORAL | Status: DC | PRN
  Filled 2015-12-09: qty 0.5

## 2015-12-09 NOTE — Progress Notes (Signed)
The Nurse requested [facility] rounding the unit to visit the family of the Pt, who were in the room with the Pt. The doctor had given the family a bad news, they wanted comfort and encouragement from the Ch. [facility] visited, offered comfort, encouragement and prayers for the Pt and family.     12/09/15 1600  Clinical Encounter Type  Visited With Patient;Patient and family together  Visit Type Initial;Spiritual support  Referral From Nurse  Consult/Referral To Chaplain  Spiritual Encounters  Spiritual Needs Prayer;Emotional;Other (Comment)

## 2015-12-09 NOTE — Care Management Note (Signed)
Case Management Note  Patient Details  Name: Katrina Gonzalez MRN: [id number] Date of Birth: [birth year]  Subjective/Objective:   Palliaitve care meeting with daughters. Disposition has changed to home with hospice. Met with [name] at bedside. She prefers hospice of [place]. No DME needs. Will go by ambulance.  Medical Necessity scheduled.               Action/Plan: Referral to Santiago Glad with [facility]  Expected Discharge Date:    12/09/2015              Expected Discharge Plan:  Home w Hospice Care  In-House Referral:  Hospice / Palliative Care  Discharge planning Services  CM Consult  Post Acute Care Choice:  Hospice Choice offered to:  Adult Children  DME Arranged:    DME Agency:     HH Arranged:  Disease Management, RN [place] Agency:  [facility] [place]  Status of Service:  Completed, signed off  If discussed at [place] of Stay Meetings, dates discussed:    Additional Comments:  Jolly Mango, RN 12/09/2015, 9:22 AM

## 2015-12-09 NOTE — Progress Notes (Signed)
This writer removed iv sites from bilat forearms with catheters intact, pt tolerated well. This Clinical research associatewriter briefly reviewed d/c instructions with daughters at bedside, gave daughters scripts for oral morhpine and ativan. EMS has been notified of need to transport pt.

## 2015-12-09 NOTE — Progress Notes (Addendum)
Shift assessment completed  At 0820. Pt is in bed, family members present. Pt is on room air, lungs are clear/ decreased throughout and pt has some gurgling to her throat. HR is regular, abdomen is soft, bs heard. Pt is wearing incontinence brief, ppp, no edema noted. Heels are elevated. PIV #20 intact to bilat forearms, sites are free of redness and swelling. Pt did not speak, does not move limbs of her own accord.Dr. Nemiah CommanderKalisetti in on rounds now. NP from hospice was in at approx 0900 to speak to family members, plan is to take pt home with hospice.

## 2015-12-09 NOTE — Progress Notes (Signed)
New referral for [facility] [place] services at home following discharge received from [place]. Katrina Gonzalez is a 82 year old  woman with a past medical history of dementia, CAD, seizures, CVA, thyroid disease and HTN. She was admitted to [facility] on 12/1 for treatment of aspiration pneumonia and has received IV antibiotics and fluids. She has not improved, remains minimally verbal, not following commands. Palliative medicine was consulted for goals of care and family has chosen to focus on comfort at end of life. Writer met in the room with patient's daughter's Katrina Gonzalez and Katrina Gonzalez to initiate education regarding hospice services, philosophy and team approach to care with good understanding voiced, [name] has received hospice services in the past. Patient seen lying in bed, eyes closed, no response to verbal stimuli. Daughters report she is still eating and now requires a pureed diet with nectar thick liquids. Education provided regarding Thick It for liquids. Patient currently has a hospital bed in her home, no new DME needs at this time. Plan is for discharge home today via EMS. Attending physician Dr. Tressia Miners in during visit and is aware. Patient to have prescriptions for liquid morphine and lorazepam at discharge. Signed DNR in place in patient's chart. Patient information faxed to referral. Thank you. Flo Shanks RN, BSN, [place] [facility] [place], [facility] [phone] c

## 2015-12-09 NOTE — Progress Notes (Signed)
Pt was able to be weaned to room air this shift. Pt able to sleep in between care. Family at bedside. Pt was able to swallow medication without difficulty.

## 2015-12-09 NOTE — Progress Notes (Signed)
Daily Progress Note   Patient Name: Katrina Gonzalez       Date: 12/09/2015 DOB: May 10, 1920  Age: 82 y.o. MRN#: [id number] Attending Physician: Gladstone Lighter, MD Primary Care Physician: [place] Date: 12/05/2015  Reason for Consultation/Follow-up: Establishing goals of care  Subjective: Met with patient's daughters [name] (who also goes by Arbie Cookey), and Cephus Slater notes patient has declined significantly over the last several months. She is no longer talking. She cannot hold herself upright. She eats when food is offered, but requires hand feeding. She is now silently aspirating and has developed aspiration pneumonia. We discussed the trajectory of dementia and [name] is aware of the prognosis and progression of end stage dementia. She would like to take her mother home and keep her comfortable and would like to request Hospice services in the home. Discussed with [name] that her Mom is likely to develop pneumonia again and that an option would be to treat symptomatically at home rather vs returning to hospital. [name] noted that she would rather keep her Mom at home and treat symptoms, focus on comfort, rather than focus on length of life. She notes her Mom used to enjoy getting out of the house, going to church, and interacting with others. She notes her Mom would want to be comfortable and not have multiple admissions to hospital at end of life.  Review of Systems  Unable to perform ROS: Dementia    Length of Stay: 4  Current Medications: Scheduled Meds:  . albuterol  2.5 mg Nebulization QID  . amLODipine  5 mg Oral Daily  . amoxicillin-clavulanate  1 tablet Oral Q12H  . divalproex  125 mg Oral Q8H  . enoxaparin (LOVENOX) injection  40 mg Subcutaneous Q24H  .  levothyroxine  100 mcg Oral QAC breakfast  . lisinopril  10 mg Oral Daily  . mouth rinse  15 mL Mouth Rinse BID  . pravastatin  20 mg Oral q1800  . senna-docusate  1 tablet Oral QHS    Continuous Infusions:   PRN Meds: acetaminophen **OR** acetaminophen, albuterol, ondansetron **OR** ondansetron (ZOFRAN) IV  Physical Exam  Constitutional: No distress.  cacetic  Pulmonary/Chest:  Cannot clear secretions on her own  Musculoskeletal:  Unable to maintain self in upright position, unable to hold head up independently, BLE  with contractures, does not follow commands  Neurological:  Opens eyes to voices, does not track, nonverbal            Vital Signs: BP 127/65 (BP Location: Left Arm)   Pulse 85   Temp 99.8 F (37.7 C) (Axillary)   Resp 20   Ht _0  (1.626 m)   Wt 63.5 kg (140 lb)   SpO2 100%   BMI 24.03 kg/m  SpO2: SpO2: 100 % O2 Device: O2 Device: Not Delivered O2 Flow Rate: O2 Flow Rate (L/min): 2 L/min (placed on room air)  Intake/output summary:  Intake/Output Summary (Last 24 hours) at 12/09/15 0908 Last data filed at 12/08/15 1343  Gross per 24 hour  Intake              120 ml  Output                0 ml  Net              120 ml   LBM: Last BM Date: 12/08/15 Baseline Weight: Weight: 63.5 kg (140 lb) Most recent weight: Weight: 63.5 kg (140 lb)       Palliative Assessment/Data: PPS: 30%    Flowsheet Rows   Flowsheet Row Most Recent Value  Intake Tab  Referral Department  Hospitalist  Unit at Time of Referral  Orthopedic Unit  Palliative Care Primary Diagnosis  Cardiac  Date Notified  12/05/15  Palliative Care Type  New Palliative care  Reason for referral  Clarify Goals of Care  Date of Admission  12/05/15  Date first seen by Palliative Care  12/08/15  # of days IP prior to Palliative referral  0  Clinical Assessment  Palliative Performance Scale Score  10%  Psychosocial & Spiritual Assessment  Social Work Plan of Care  Education on Hospice,  Advance care planning  Palliative Care Outcomes  Patient/Family meeting held?  Yes  Who was at the meeting?  patient and daughter, [name]  Palliative Care follow-up planned  Yes, Facility      Patient Active Problem List   Diagnosis Date Noted  . Goals of care, counseling/discussion   . Palliative care by specialist   . Aspiration pneumonitis ([place]) 12/05/2015  . Acute metabolic encephalopathy 73/71/0626  . Dysphagia 12/05/2015  . Hyponatremia 12/05/2015  . Pyuria 12/05/2015  . Respiratory failure with hypoxia ([place]) 12/05/2015  . Acute encephalopathy 10/07/2014  . Seizure ([place]) 10/07/2014  . Sepsis secondary to UTI ([place]) 10/07/2014  . Dementia 10/07/2014    Palliative Care Assessment & Plan   Patient Profile: 82 y.o. female  with past medical history of dementia, seizures, HTN, CAD, CVA admitted on 12/05/2015 with chest congestion, cough, weakness. Workup revealed aspiration pnuemonia and dysphagia. Palliative medicine consulted for [place].    Assessment/Recommendations/Plan   DNR  Care management consult for hospice at home  Transition to comfort measures --SOB:   -Morphine intensol (65m/0.5mL) 550mSL q1hr prn moderate SOB and as premed for any  exertional activity  -Keep room cool --Anxiety/Agitation:  -lorazepam 81m74mL or IV q 4 hours prn  -haloperidol .5mg76m, IV, SL q 4hrs prn --Pulmonary edema/Audible Terminal Secretions:  - Robinul 1 mg po or .2mg 26mor SL q 4 hrs prn --Stop IV fluids  --Consult to SW for residential hospice placement  Liberalize diet to patient preferences- she enjoys sweet foods   Goals of Care and Additional Recommendations:  Limitations on Scope of Treatment: Full Comfort Care  Code Status:  DNR  Prognosis:   < 3 months d/t advanced dementia with dysphagia, aspiration pneumonia, transition to comfort care  Discharge Planning:  Home with Hospice  Care plan was discussed with patient's daughters [name] and Anabel Bodo, RN; Lattie Haw,  Case Manager RN   Thank you for allowing the Palliative Medicine Team to assist in the care of this patient.   Time In: 0800 Time Out: 0920 Total Time 65mns Prolonged Time Billed Yes      Greater than 50%  of this time was spent counseling and coordinating care related to the above assessment and plan.  KMariana Kaufman AGNP-C Palliative Medicine   Please contact Palliative Medicine Team phone at [phone] questions and concerns.

## 2015-12-09 NOTE — Discharge Instructions (Signed)
Puree diet with nectar thick liquids

## 2015-12-09 NOTE — Plan of Care (Signed)
Problem: Tissue Perfusion: Goal: Risk factors for ineffective tissue perfusion will decrease Outcome: Adequate for Discharge Pt is to be discharged home with hospice care today. Family is aware of all orders and has discussed hospice with providers.

## 2015-12-09 NOTE — Care Management Important Message (Signed)
Important Message  Patient Details  Name: Consuelo PandyMittie WS Somoza MRN: [id number] Date of Birth: 08/27/1920   Medicare Important Message Given:  Yes    Marily MemosLisa M Kateline Kinkade, RN 12/09/2015, 9:34 AM

## 2015-12-09 NOTE — Plan of Care (Signed)
Problem: SLP Dysphagia Goals Goal: Misc Dysphagia Goal Pt will safely tolerate po diet of least restrictive consistency w/ no overt s/s of aspiration noted by Staff/pt/family x3 sessions.    

## 2015-12-10 NOTE — Discharge Summary (Signed)
[employer] [facility] [place] [facility]   PATIENT NAME: Katrina Gonzalez    MR#:  [id number]  DATE OF BIRTH:  Jun 11, 1920  DATE OF ADMISSION:  12/05/2015   ADMITTING PHYSICIAN: Katharina Caperima Vaickute, MD  DATE OF DISCHARGE: 12/09/2015 12:43 PM  PRIMARY CARE PHYSICIAN: [facility]   ADMISSION DIAGNOSIS:   Hypomagnesemia [E83.42] Hyponatremia [E87.1] SOB (shortness of breath) [R06.02] Acute respiratory failure with hypoxemia (HCC) [J96.01] Aspiration pneumonia of right lower lobe due to vomit (HCC) [J69.0]  DISCHARGE DIAGNOSIS:   Principal Problem:   Aspiration pneumonitis (HCC) Active Problems:   Acute metabolic encephalopathy   Dysphagia   Hyponatremia   Pyuria   Respiratory failure with hypoxia (HCC)   Goals of care, counseling/discussion   Palliative care by specialist   Terminal care   SECONDARY DIAGNOSIS:   Past Medical History:  Diagnosis Date  . Coronary artery disease   . Hypertension   . Seizures (HCC)   . Stroke (HCC)   . Thyroid disease     HOSPITAL COURSE:   82 y/o F with PMH of CAD, HTN, seizure disorder, dementia presents from home secondary to resp distress and hypoxia.  #1 acute hypoxic respiratory failure-secondary to aspiration pneumonia. -Appreciate speech therapy consult. Diet adjusted -Unable to cough or phlegm, unable to tolerate chest physiotherapy. -Cough medications and treatments. -on Unasyn - changed to augmentin at discharge -Follow up chest x-ray with no changes -Wean oxygen as tolerated. Needing about 1-2L - poor prognosis, possibility of recurrent pneumonias - appreciate speech consult- on puree and nectar thick liquids.  #2 hyponatremia-secondary to hypovolemic hyponatremia. -improved with fluids.  #3 seizure disorder-continue Depakote. No active seizures.  #4 hypertension-on lisinopril and Norvasc. Adjust doses if needed. Blood pressure is better now  Palliative care consulted.   Patient is being sent home with hospice services.   DISCHARGE CONDITIONS:   Guarded  CONSULTS OBTAINED:     DRUG ALLERGIES:   No Known Allergies DISCHARGE MEDICATIONS:     Medication List    STOP taking these medications   cephALEXin 500 MG capsule Commonly known as:  KEFLEX   lovastatin 20 MG tablet Commonly known as:  MEVACOR   Potassium Chloride CR 8 MEQ Cpcr capsule CR Commonly known as:  MICRO-K   quinapril 10 MG tablet Commonly known as:  ACCUPRIL     TAKE these medications   amoxicillin-clavulanate 875-125 MG tablet Commonly known as:  AUGMENTIN Take 1 tablet by mouth 2 (two) times daily. X 4 more days   divalproex 125 MG DR tablet Commonly known as:  DEPAKOTE Take 125 mg by mouth 2 (two) times daily. What changed:  Another medication with the same name was removed. Continue taking this medication, and follow the directions you see here.   glycopyrrolate 1 MG tablet Commonly known as:  ROBINUL Take 1 tablet (1 mg total) by mouth every 4 (four) hours as needed (excessive secretions).   levothyroxine 100 MCG tablet Commonly known as:  SYNTHROID, LEVOTHROID Take 100 mcg by mouth daily before breakfast.   LORazepam 1 MG tablet Commonly known as:  ATIVAN Take 0.5 tablets (0.5 mg total) by mouth every 4 (four) hours as needed for anxiety. What changed:  medication strength  when to take this  reasons to take this   morphine CONCENTRATE 10 MG/0.5ML Soln concentrated solution Take 0.25 mLs (5 mg total) by mouth every 4 (four) hours as needed for moderate pain, severe pain or anxiety (or dyspnea).  sennosides-docusate sodium 8.6-50 MG tablet Commonly known as:  SENOKOT-S Take 1 tablet by mouth at bedtime.        DISCHARGE INSTRUCTIONS:   1. Home with hospice services  DIET:   Cardiac diet  ACTIVITY:   Activity as tolerated  OXYGEN:   Home Oxygen: Yes.    Oxygen Delivery: 2 liters/min via Patient connected to nasal cannula  oxygen  DISCHARGE LOCATION:   home   If you experience worsening of your admission symptoms, develop shortness of breath, life threatening emergency, suicidal or homicidal thoughts you must seek medical attention immediately by calling 911 or calling your MD immediately  if symptoms less severe.  You Must read complete instructions/literature along with all the possible adverse reactions/side effects for all the Medicines you take and that have been prescribed to you. Take any new Medicines after you have completely understood and accpet all the possible adverse reactions/side effects.   Please note  You were cared for by a hospitalist during your hospital stay. If you have any questions about your discharge medications or the care you received while you were in the hospital after you are discharged, you can call the unit and asked to speak with the hospitalist on call if the hospitalist that took care of you is not available. Once you are discharged, your primary care physician will handle any further medical issues. Please note that NO REFILLS for any discharge medications will be authorized once you are discharged, as it is imperative that you return to your primary care physician (or establish a relationship with a primary care physician if you do not have one) for your aftercare needs so that they can reassess your need for medications and monitor your lab values.    On the day of Discharge:  VITAL SIGNS:   Blood pressure 127/65, pulse 93, temperature 99.2 F (37.3 C), temperature source Axillary, resp. rate 20, height 5\' 4"  (1.626 m), weight 63.5 kg (140 lb), SpO2 99 %.  PHYSICAL EXAMINATION:   GENERAL:  82 y.o.-year-old patient sitting in the bed with no acute distress. Opening eyes to tactile stimulation. EYES: Pupils equal, round, reactive to light and accommodation. No scleral icterus. Extraocular muscles intact.  HEENT: Head atraumatic, normocephalic. Oropharynx and nasopharynx  clear.  NECK:  Supple, no jugular venous distention. No thyroid enlargement, no tenderness.  LUNGS: clear breath sounds bilaterally at the bases, more congested in upper airway, no wheeze or rales. Significant rhonchi present.. No use of accessory muscles of respiration.  CARDIOVASCULAR: S1, S2 normal. No  rubs, or gallops. 3/6 systolic murmur present. ABDOMEN: Soft, nontender, nondistended. Bowel sounds present. No organomegaly or mass.  EXTREMITIES: No pedal edema, cyanosis, or clubbing.  NEUROLOGIC: not following commands. Contracted extremities PSYCHIATRIC: The patient is alert but not communicative. At baseline she is nonverbal mostly. SKIN: No obvious rash, lesion, or ulcer.   DATA REVIEW:   CBC  Recent Labs Lab 12/07/15 0449  WBC 6.3  HGB 11.1*  HCT 31.9*  PLT 133*    Chemistries   Recent Labs Lab 12/05/15 1036  12/08/15 0354  NA 122*  < > 129*  K 4.5  < > 3.9  CL 88*  < > 97*  CO2 26  < > 28  GLUCOSE 91  < > 90  BUN 12  < > 14  CREATININE 0.37*  < > 0.44  CALCIUM 7.8*  < > 7.4*  MG 1.1*  --   --   < > = values in  this interval not displayed.   Microbiology Results  Results for orders placed or performed during the hospital encounter of 12/05/15  Blood Culture (routine x 2)     Status: Abnormal   Collection Time: 12/05/15 11:40 AM  Result Value Ref Range Status   Specimen Description BLOOD BLOOD RIGHT ARM  Final   Special Requests   Final    BOTTLES DRAWN AEROBIC AND ANAEROBIC ANA AER   Culture  Setup Time   Final    IN BOTH AEROBIC AND ANAEROBIC BOTTLES GRAM POSITIVE COCCI CRITICAL RESULT CALLED TO, READ BACK BY AND VERIFIED WITH: HENRY ZOMPA ON 12/06/15 AT 1033 [facility] Performed at [facility]    Culture STAPHYLOCOCCUS SPECIES (COAGULASE NEGATIVE) (A)  Final   Report Status 12/08/2015 FINAL  Final   Organism ID, Bacteria STAPHYLOCOCCUS SPECIES (COAGULASE NEGATIVE)  Final      Susceptibility   Staphylococcus species (coagulase negative) -  MIC*    CIPROFLOXACIN >=8 RESISTANT Resistant     ERYTHROMYCIN <=0.25 SENSITIVE Sensitive     GENTAMICIN <=0.5 SENSITIVE Sensitive     OXACILLIN Value in next row Sensitive      <=0.25 SENSITIVEWARNING: For oxacillin-resistant S.aureus and coagulase-negative staphylococci (MRS), other beta-lactam agents, ie, penicillins, beta-lactam/beta-lactamase inhibitor combinations, cephems (with the exception of the cephalosporins with anti-MRSA activity), and carbapenems, may appear active in vitro, but are not effective clinically.  --CLSI, Vol.32 No.3, January 2012, pg 70.    TETRACYCLINE Value in next row Resistant      <=0.25 SENSITIVEWARNING: For oxacillin-resistant S.aureus and coagulase-negative staphylococci (MRS), other beta-lactam agents, ie, penicillins, beta-lactam/beta-lactamase inhibitor combinations, cephems (with the exception of the cephalosporins with anti-MRSA activity), and carbapenems, may appear active in vitro, but are not effective clinically.  --CLSI, Vol.32 No.3, January 2012, pg 70.    VANCOMYCIN Value in next row Sensitive      <=0.25 SENSITIVEWARNING: For oxacillin-resistant S.aureus and coagulase-negative staphylococci (MRS), other beta-lactam agents, ie, penicillins, beta-lactam/beta-lactamase inhibitor combinations, cephems (with the exception of the cephalosporins with anti-MRSA activity), and carbapenems, may appear active in vitro, but are not effective clinically.  --CLSI, Vol.32 No.3, January 2012, pg 70.    TRIMETH/SULFA Value in next row Sensitive      <=0.25 SENSITIVEWARNING: For oxacillin-resistant S.aureus and coagulase-negative staphylococci (MRS), other beta-lactam agents, ie, penicillins, beta-lactam/beta-lactamase inhibitor combinations, cephems (with the exception of the cephalosporins with anti-MRSA activity), and carbapenems, may appear active in vitro, but are not effective clinically.  --CLSI, Vol.32 No.3, January 2012, pg 70.    CLINDAMYCIN Value in next row  Sensitive      <=0.25 SENSITIVEWARNING: For oxacillin-resistant S.aureus and coagulase-negative staphylococci (MRS), other beta-lactam agents, ie, penicillins, beta-lactam/beta-lactamase inhibitor combinations, cephems (with the exception of the cephalosporins with anti-MRSA activity), and carbapenems, may appear active in vitro, but are not effective clinically.  --CLSI, Vol.32 No.3, January 2012, pg 70.    RIFAMPIN Value in next row Sensitive      <=0.25 SENSITIVEWARNING: For oxacillin-resistant S.aureus and coagulase-negative staphylococci (MRS), other beta-lactam agents, ie, penicillins, beta-lactam/beta-lactamase inhibitor combinations, cephems (with the exception of the cephalosporins with anti-MRSA activity), and carbapenems, may appear active in vitro, but are not effective clinically.  --CLSI, Vol.32 No.3, January 2012, pg 70.    * STAPHYLOCOCCUS SPECIES (COAGULASE NEGATIVE)  Urine culture     Status: Abnormal   Collection Time: 12/05/15 11:40 AM  Result Value Ref Range Status   Specimen Description URINE, RANDOM  Final   Special Requests NONE  Final  Culture (A)  Final    <10,000 COLONIES/mL INSIGNIFICANT GROWTH Performed at [facility]    Report Status 12/06/2015 FINAL  Final  Blood Culture ID Panel (Reflexed)     Status: Abnormal   Collection Time: 12/05/15 11:40 AM  Result Value Ref Range Status   Enterococcus species NOT DETECTED NOT DETECTED Final   Listeria monocytogenes NOT DETECTED NOT DETECTED Final   Staphylococcus species DETECTED (A) NOT DETECTED Final    Comment: CRITICAL RESULT CALLED TO, READ BACK BY AND VERIFIED WITH: HENRY ZOMPA ON 12/06/15 AT 1033 [facility]    Staphylococcus aureus NOT DETECTED NOT DETECTED Final   Methicillin resistance NOT DETECTED NOT DETECTED Final   Streptococcus species NOT DETECTED NOT DETECTED Final   Streptococcus agalactiae NOT DETECTED NOT DETECTED Final   Streptococcus pneumoniae NOT DETECTED NOT DETECTED Final   Streptococcus  pyogenes NOT DETECTED NOT DETECTED Final   Acinetobacter baumannii NOT DETECTED NOT DETECTED Final   Enterobacteriaceae species NOT DETECTED NOT DETECTED Final   Enterobacter cloacae complex NOT DETECTED NOT DETECTED Final   Escherichia coli NOT DETECTED NOT DETECTED Final   Klebsiella oxytoca NOT DETECTED NOT DETECTED Final   Klebsiella pneumoniae NOT DETECTED NOT DETECTED Final   Proteus species NOT DETECTED NOT DETECTED Final   Serratia marcescens NOT DETECTED NOT DETECTED Final   Haemophilus influenzae NOT DETECTED NOT DETECTED Final   Neisseria meningitidis NOT DETECTED NOT DETECTED Final   Pseudomonas aeruginosa NOT DETECTED NOT DETECTED Final   Candida albicans NOT DETECTED NOT DETECTED Final   Candida glabrata NOT DETECTED NOT DETECTED Final   Candida krusei NOT DETECTED NOT DETECTED Final   Candida parapsilosis NOT DETECTED NOT DETECTED Final   Candida tropicalis NOT DETECTED NOT DETECTED Final  Blood Culture (routine x 2)     Status: Abnormal   Collection Time: 12/05/15  1:18 PM  Result Value Ref Range Status   Specimen Description BLOOD RIGHT ARM  Final   Special Requests   Final    BOTTLES DRAWN AEROBIC AND ANAEROBIC  AEROBIC 8CC, ANAEROBIC 9CC   Culture  Setup Time   Final    GRAM POSITIVE COCCI AEROBIC BOTTLE ONLY CRITICAL RESULT CALLED TO, READ BACK BY AND VERIFIED WITH: HENRY ZOMPA ON 12/06/15 AT 1205 [facility]    Culture (A)  Final    STAPHYLOCOCCUS SPECIES (COAGULASE NEGATIVE) SUSCEPTIBILITIES PERFORMED ON PREVIOUS CULTURE WITHIN THE LAST 5 DAYS. Performed at [facility]    Report Status 12/08/2015 FINAL  Final  Culture, sputum-assessment     Status: None   Collection Time: 12/05/15 10:09 PM  Result Value Ref Range Status   Specimen Description SPU  Final   Special Requests NONE  Final   Sputum evaluation   Final    THIS SPECIMEN IS ACCEPTABLE FOR SPUTUM CULTURE Performed at [facility]    Report Status 12/06/2015 FINAL  Final  Culture,  respiratory (NON-Expectorated)     Status: None   Collection Time: 12/05/15 10:09 PM  Result Value Ref Range Status   Specimen Description SPU  Final   Special Requests NONE Reflexed from [id number]  Final   Gram Stain   Final    ABUNDANT WBC PRESENT, PREDOMINANTLY PMN FEW SQUAMOUS EPITHELIAL CELLS PRESENT FEW GRAM POSITIVE COCCI IN PAIRS RARE GRAM VARIABLE ROD    Culture   Final    Consistent with normal respiratory flora. Performed at [facility]     Report Status 12/08/2015 FINAL  Final    RADIOLOGY:  No  results found.   Management plans discussed with the patient, family and they are in agreement.  CODE STATUS:  Code Status History    Date Active Date Inactive Code Status Order ID Comments User Context   12/09/2015  9:24 AM 12/09/2015  3:43 PM DNR [id number]  Barbara Cower, NP Inpatient   12/05/2015  3:05 PM 12/09/2015  9:24 AM DNR [id number]  [name], MD Inpatient   10/07/2014  7:31 AM 10/08/2014  3:28 PM DNR [id number]  Crissie Figures, MD Inpatient    Questions for Most Recent Historical Code Status (Order [id number])    Question Answer Comment   In the event of cardiac or respiratory ARREST Do not call a "code blue"    In the event of cardiac or respiratory ARREST Do not perform Intubation, CPR, defibrillation or ACLS    In the event of cardiac or respiratory ARREST Use medication by any route, position, wound care, and other measures to relive pain and suffering. May use oxygen, suction and manual treatment of airway obstruction as needed for comfort.         Advance Directive Documentation   Flowsheet Row Most Recent Value  Type of Advance Directive  -- [DNR]  Pre-existing out of facility DNR order (yellow form or pink MOST form)  No data  "MOST" Form in Place?  No data      TOTAL TIME TAKING CARE OF THIS PATIENT: 37 minutes.    Enid Baas M.D on 12/10/2015 at 10:17 AM  Between 7am to 6pm - Pager - [phone]  After 6pm go to [email or web address] -  Scientist, research (life sciences) [facility]  Office  [phone]  CC: Primary care physician; [facility]   Note: This dictation was prepared with Dragon dictation along with smaller phrase technology. Any transcriptional errors that result from this process are unintentional.

## 2016-02-05 DEATH — deceased
